# Patient Record
Sex: Female | Born: 1975 | Race: White | Hispanic: No | Marital: Single | Smoking: Former smoker
Health system: Southern US, Community
[De-identification: ages and names within clinical notes are randomized; demographics above are authoritative.]

## PROBLEM LIST (undated history)

## (undated) DIAGNOSIS — N390 Urinary tract infection, site not specified: Secondary | ICD-10-CM

## (undated) DIAGNOSIS — E119 Type 2 diabetes mellitus without complications: Secondary | ICD-10-CM

## (undated) DIAGNOSIS — F419 Anxiety disorder, unspecified: Secondary | ICD-10-CM

## (undated) HISTORY — PX: CHOLECYSTECTOMY: SHX55

---

## 2000-02-24 HISTORY — PX: SINUS EXPLORATION: SHX5214

## 2014-05-05 ENCOUNTER — Emergency Department (HOSPITAL_COMMUNITY)
Admission: EM | Admit: 2014-05-05 | Discharge: 2014-05-05 | Disposition: A | Discharge status: Home / Self Care | Payer: No Typology Code available for payment source | Attending: Emergency Medicine | Admitting: Emergency Medicine

## 2014-05-05 ENCOUNTER — Encounter (HOSPITAL_COMMUNITY): Payer: Self-pay | Admitting: Emergency Medicine

## 2014-05-05 DIAGNOSIS — S61432A Puncture wound without foreign body of left hand, initial encounter: Secondary | ICD-10-CM | POA: Diagnosis present

## 2014-05-05 DIAGNOSIS — S61032A Puncture wound without foreign body of left thumb without damage to nail, initial encounter: Secondary | ICD-10-CM | POA: Diagnosis not present

## 2014-05-05 DIAGNOSIS — L089 Local infection of the skin and subcutaneous tissue, unspecified: Secondary | ICD-10-CM | POA: Insufficient documentation

## 2014-05-05 DIAGNOSIS — Y9389 Activity, other specified: Secondary | ICD-10-CM | POA: Insufficient documentation

## 2014-05-05 DIAGNOSIS — Y9289 Other specified places as the place of occurrence of the external cause: Secondary | ICD-10-CM | POA: Diagnosis not present

## 2014-05-05 DIAGNOSIS — W5501XA Bitten by cat, initial encounter: Secondary | ICD-10-CM | POA: Insufficient documentation

## 2014-05-05 DIAGNOSIS — Y998 Other external cause status: Secondary | ICD-10-CM | POA: Diagnosis not present

## 2014-05-05 DIAGNOSIS — S61452A Open bite of left hand, initial encounter: Secondary | ICD-10-CM

## 2014-05-05 MED ORDER — MORPHINE SULFATE 4 MG/ML IJ SOLN
4.0000 mg | Freq: Once | INTRAMUSCULAR | Status: AC
  Administered 2014-05-05: 4 mg via INTRAVENOUS
  Filled 2014-05-05: qty 1

## 2014-05-05 MED ORDER — ONDANSETRON HCL 4 MG/2ML IJ SOLN
4.0000 mg | INTRAMUSCULAR | Status: AC
  Administered 2014-05-05: 4 mg via INTRAVENOUS
  Filled 2014-05-05: qty 2

## 2014-05-05 MED ORDER — LIDOCAINE HCL 1 % IJ SOLN
INTRAMUSCULAR | Status: AC
  Filled 2014-05-05: qty 20

## 2014-05-05 MED ORDER — LIDOCAINE-EPINEPHRINE 1 %-1:100000 IJ SOLN
INTRAMUSCULAR | Status: AC
  Filled 2014-05-05: qty 1

## 2014-05-05 MED ORDER — SODIUM CHLORIDE 0.9 % IV SOLN
3.0000 g | Freq: Once | INTRAVENOUS | Status: AC
  Administered 2014-05-05: 3 g via INTRAVENOUS
  Filled 2014-05-05: qty 3

## 2014-05-05 MED ORDER — LIDOCAINE HCL 2 % IJ SOLN
10.0000 mL | Freq: Once | INTRAMUSCULAR | Status: AC
  Administered 2014-05-05: 200 mg
  Filled 2014-05-05: qty 20

## 2014-05-05 NOTE — ED Notes (Signed)
Bed: WA07 Expected date:  Expected time:  Means of arrival:  Comments: [employer] for [employer]

## 2014-05-05 NOTE — ED Notes (Addendum)
Pt. Came in to Ed with complaint of animal bite, pt. Stated that she got bitten by her own cat at 1030pm last night on her left great thumb. This morning pt. Noted the affected site to be swollen and red and painful. Pt. Stated that her cat died at 11pm the same night she got bitten , pt. Stated that her cat had on and off seizures,  Pt. Stated that her cat had 10 minutes seizure before the cat  died.  Pt.'s cat was never noted to be sick , pt.'s cat was 5046years old . Pt.'s cat was updated of her vaccination and seen by Vet regularly.

## 2014-05-05 NOTE — Discharge Instructions (Signed)
Please follow the directions provided.  Dr. Carlos LeveringGramig's office should contact you to arrange follow-up.  Take your antibiotic as directed.  Don't hesitate to return for any new, worsening or concerning symptoms.     SEEK MEDICAL CARE IF:  You notice warmth, redness, soreness, swelling, pus discharge, or a bad smell coming from the wound.  You have a red line on the skin coming from the wound.  You have a fever, chills, or a general ill feeling.  You have nausea or vomiting.  You have continued or worsening pain.  You have trouble moving the injured part.  You have other questions or concerns.

## 2014-05-05 NOTE — ED Provider Notes (Signed)
CSN: [id number]     Arrival date & time 05/05/14  2017 History   First MD Initiated Contact with Patient 05/05/14 2024     This chart was scribed for non-physician practitioner, Harle BattiestElizabeth Calah Gershman, NP working with Nelva Nayobert Beaton, MD by Arlan OrganAshley Leger, ED Scribe. This patient was seen in room WTR7/WTR7 and the patient's care was started at 8:34 PM.   Chief Complaint  Patient presents with  . Animal Bite    cat   The history is provided by the patient. No language interpreter was used.    HPI Comments: Miranda Lawson is a 39 y.o. female who presents to the Emergency Department here after sustaining a cat bite and scratch to the L hand yesterday at approximately 10:30 PM. Pt states she was attempting to move her 41 year old, otherwise healthy cat into her crate after the animal was having multiple seizures when her cat bite her. Cat then passed at 11:00 PM last night. Ms. Kulpa noted swelling,  pain and redness to L thumb this morning. Last full meal approximately 1 hour ago. Ms. Olivares denies any fever or chills. Tetanus UTD in last 5 years. Pet cat immunizations UTD. LNMP today. No known allergies to medications.  No past medical history on file. No past surgical history on file. No family history on file. History  Substance Use Topics  . Smoking status: Not on file  . Smokeless tobacco: Not on file  . Alcohol Use: Not on file   OB History    No data available     Review of Systems  Constitutional: Negative for fever and chills.  Skin: Positive for color change and wound.     Allergies  Review of patient's allergies indicates not on file.  Home Medications   Prior to Admission medications   Not on File   Triage Vitals: BP 146/88 mmHg  Pulse 88  Temp(Src) 98.8 F (37.1 C) (Oral)  Resp 18  SpO2 99%   Physical Exam  Constitutional: She is oriented to person, place, and time. She appears well-developed and well-nourished.  HENT:  Head: Normocephalic.  Eyes: EOM are normal.  Neck:  Normal range of motion.  Pulmonary/Chest: Effort normal.  Abdominal: She exhibits no distension.  Musculoskeletal: Normal range of motion. She exhibits tenderness.  Mild tenderness noted to L thumb  Neurological: She is alert and oriented to person, place, and time.  5/5 strength to R thumb  Skin: There is erythema.  Small scratch mark to dorsal aspect of hand medial to to thumb and lateral to the arm 2 small punctate bite marks to the medial aspect of the first phalanx but with redness, swelling, and induration. Red streaking noted to R thumb  Psychiatric: She has a normal mood and affect.  Nursing note and vitals reviewed.   ED Course  Procedures (including critical care time)  DIAGNOSTIC STUDIES: Oxygen Saturation is 99% on RA, Normal by my interpretation.    COORDINATION OF CARE: 8:40 PM- Will consult with hand surgeon. Discussed treatment plan with pt at bedside and pt agreed to plan.     Labs Review Labs Reviewed - No data to display  Imaging Review No results found.   EKG Interpretation None      MDM   Final diagnoses:  Bite wound of left hand with infection, initial encounter   39 yo with redness, swelling and tenderness to left thumb 1 Meding after cat bite with streaking up her wrist. Consulted Dr. Amanda PeaGramig (hand surgery), requested  suture cart, 2% lidocaine without epi and hand tray by the bedside and he will eval in the ED. Pt given 3 gm Unasyn IV.  Pt seen by Dr. Amanda Pea in the ED with instructions for follow-up and prescriptions provided. She is well-appearing, in no acute distress and vital signs reviewed and not concerning. She appears safe to be discharged.  Discharge include follow-up with Dr. Carlos Levering office as instructed by him. Return precautions provided. Pt aware of plan and in agreement.    I personally performed the services described in this documentation, which was scribed in my presence. The recorded information has been reviewed and is  accurate.  Filed Vitals:   05/05/14 2020 05/05/14 2246  BP: 146/88 139/78  Pulse: 88 76  Temp: 98.8 F (37.1 C) 98.3 F (36.8 C)  TempSrc: Oral Oral  Resp: 18 16  SpO2: 99% 96%   Meds given in ED:  Medications  Ampicillin-Sulbactam (UNASYN) 3 g in sodium chloride 0.9 % 100 mL IVPB (0 g Intravenous Stopped 05/05/14 2255)  morphine 4 MG/ML injection 4 mg (4 mg Intravenous Given 05/05/14 2127)  ondansetron (ZOFRAN) injection 4 mg (4 mg Intravenous Given 05/05/14 2127)  lidocaine (XYLOCAINE) 2 % (with pres) injection 200 mg (200 mg Infiltration Given 05/05/14 2230)    Discharge Medication List as of 05/05/2014 10:32 PM       Harle Battiest, NP 05/08/14 0009  Nelva Nay, MD 05/14/14 [phone]

## 2014-05-05 NOTE — Consult Note (Signed)
Reason for Consult:leftthumb cat bite Referring Physician: ER staff  Miranda Lawson is an 39 y.o. female.  HPI: presents with a cat bite to the left thumb with infection  Patient presents for evaluation and treatment of the of their upper extremity predicament. The patient denies neck back chest or of abdominal pain. The patient notes that they have no lower extremity problems. The patient from primarily complains of the upper extremity pain noted.  History reviewed. No pertinent past medical history.  Past Surgical History  Procedure Laterality Date  . Cholecystectomy      History reviewed. No pertinent family history.  Social History:  reports that she has been smoking.  She does not have any smokeless tobacco history on file. She reports that she drinks alcohol. Her drug history is not on file.  Allergies: No Known Allergies  Medications: I have reviewed the patient's current medications.  No results found for this or any previous visit (from the past 48 hour(s)).  No results found.  Review of Systems  HENT: Negative.   Eyes: Negative.   Cardiovascular: Negative.   Gastrointestinal: Negative.   Genitourinary: Negative.   Psychiatric/Behavioral: Negative.    Blood pressure 146/88, pulse 88, temperature 98.8 F (37.1 C), temperature source Oral, resp. rate 18, SpO2 99 %. Physical Exam Infected left thumb catbite with draining wounds and no Kanavel signs The patient is alert and oriented in no acute distress the patient complains of pain in the affected upper extremity.  The patient is noted to have a normal HEENT exam.  Lung fields show equal chest expansion and no shortness of breath  abdomen exam is nontender without distention.  Lower extremity examination does not show any fracture dislocation or blood clot symptoms.  Pelvis is stable neck and back are stable and nontender Assessment/Plan: Infected cat bite left thumb  We are planning surgery for your upper extremity.  The risk and benefits of surgery include risk of bleeding infection anesthesia damage to normal structures and failure of the surgery to accomplish its intended goals of relieving symptoms and restoring function with this in mind we'll going to proceed. I have specifically discussed with the patient the pre-and postoperative regime and the does and don'ts and risk and benefits in great detail. Risk and benefits of surgery also include risk of dystrophy chronic nerve pain failure of the healing process to go onto completion and other inherent risks of surgery The relavent the pathophysiology of the disease/injury process, as well as the alternatives for treatment and postoperative course of action has been discussed in great detail with the patient who desires to proceed.  We will do everything in our power to help you (the patient) restore function to the upper extremity. Is a pleasure to see this patient today.    See full op note dictation#[id number] Dc on Augmentin 875 BID for 2 weeks and Oxy IR prn pain  Follow up Monday my office We will call to arrange  Keep bandage clean and dry.  Call for any problems.  No smoking.  Criteria for driving a car: you should be off your pain medicine for 7-8 hours, able to drive one handed(confident), thinking clearly and feeling able in your judgement to drive. Continue elevation as it will decrease swelling.  If instructed by MD move your fingers within the confines of the bandage/splint.  Use ice if instructed by your MD. Call immediately for any sudden loss of feeling in your hand/arm or change in functional abilities of the  extremity.  We recommend that you to take vitamin C 1000 mg a Friedel to promote healing we also recommend that if you require her pain medicine that he take a stool softener to prevent constipation as most pain medicines will have constipation side effects. We recommend either Peri-Colace or Senokot and recommend that you also consider adding  MiraLAX to prevent the constipation affects from pain medicine if you are required to use them. These medicines are over the counter and maybe purchased at a local pharmacy.  Karen Chafe 05/05/2014, 10:36 PM

## 2014-05-06 NOTE — Op Note (Signed)
NAMTomasa Lawson:  Lawson, Miranda                   ACCOUNT NO.:  [id number]  MEDICAL RECORD NO.:  [id number]  LOCATION:  WA07                         [facility]  PHYSICIAN:  Dionne AnoWilliam M. Belicia Difatta, M.D.DATE OF BIRTH:  03-Nov-1975  DATE OF PROCEDURE: DATE OF DISCHARGE:                              OPERATIVE REPORT   PREOPERATIVE DIAGNOSIS:  Cat bite with abscess, left thumb.  POSTOPERATIVE DIAGNOSIS:  Cat bite with abscess, left thumb.  PROCEDURES: 1. Irrigation and debridement, deep abscess, left thumb dorsoulnar     aspect. 2. I and D, deep abscess, pulp tissue volar in location, left thumb. 3. Partial nail plate removal, left thumb with nail bed debridement.  SURGEON:  Dionne AnoWilliam M. Amanda PeaGramig, M.D.  ASSISTANT:  None.  COMPLICATIONS:  None.  ANESTHESIA:  Local intermetacarpal block and field block.  INDICATION OF PROCEDURE:  This patient is a pleasant 39 year old female who yesterday was bitten by a cat in multiple locations about her thumb. She has no evidence of advanced flexor sheath infection.  She has obvious abnormality about the thumb with infection.  I have counseled her in regard to risks and benefits of surgery and she desires to proceed.  DESCRIPTION OF PROCEDURE:  The patient was seen by myself, underwent a smooth induction of block anesthetic in the form of lidocaine without epinephrine.  She was prepped and draped in usual sterile fashion with Betadine scrub x2.  Following this, she very carefully and cautiously had an incision made dorsoulnarly.  It was very apparent that the dorsoulnar wound tract deeply.  She had purulence emanated from the dorsoulnar aspect of her nail and this was evidence of a communicating paronychia involvement.  I thus performed a partial nail plate removal and then communicated the 2 sinus tracts together.  A drain was placed. This was an excisional debridement of skin, subcutaneous tissue, and I and D of the deep abscess dorsoulnarly.  Following  this, the volar pulp underwent similar I and D.  The patient had an incision made without difficulty and the tract was probed.  This was an I and D without difficulty, but deep abscess about the pulp excisional in nature.  She tolerated this well.  There were no complicating features.  I placed a drain through and through from this area to the dorsoulnar wound.  I then irrigated with greater than a L saline and washed the thumb aggressively.  Tourniquet time was brief and less than 20 minutes.  She had excellent refill and no complicating features.  She was dressed with Adaptic, Xeroform.  We will plan for Augmentin 875 mg 1 p.o. b.i.d. and OxyIR p.r.n. pain. Stool softener, vitamin C, and other measures were discussed and will be adhered to.  I will call her at her home number at [phone], a regime of whirlpool to be begun Monday with associated removal of the drain.  I have discussed with her that this will likely be a slow healing time, but nevertheless, I am confident she has a good chance of healing this without further aggressive surgical issues.  These notes have been discussed and all questions have been encouraged and answered. We will be immediately available  should any problems occur of course.  It is a pleasure to see her today and participate in her care.  We look forward to participate in postop recovery.     Dionne Ano. Amanda Pea, M.D.     [facility]  D:  05/05/2014  T:  05/05/2014  Job:  [id number]

## 2014-05-08 LAB — WOUND CULTURE: Gram Stain: NONE SEEN

## 2014-11-21 ENCOUNTER — Other Ambulatory Visit: Payer: Self-pay | Admitting: Internal Medicine

## 2014-11-21 DIAGNOSIS — Z1231 Encounter for screening mammogram for malignant neoplasm of breast: Secondary | ICD-10-CM

## 2014-11-30 ENCOUNTER — Ambulatory Visit
Admission: RE | Admit: 2014-11-30 | Discharge: 2014-11-30 | Disposition: A | Discharge status: Home / Self Care | Payer: No Typology Code available for payment source | Source: Ambulatory Visit | Attending: Internal Medicine | Admitting: Internal Medicine

## 2014-11-30 DIAGNOSIS — Z1231 Encounter for screening mammogram for malignant neoplasm of breast: Secondary | ICD-10-CM

## 2014-12-04 ENCOUNTER — Other Ambulatory Visit: Payer: Self-pay | Admitting: Internal Medicine

## 2014-12-04 DIAGNOSIS — R928 Other abnormal and inconclusive findings on diagnostic imaging of breast: Secondary | ICD-10-CM

## 2014-12-12 ENCOUNTER — Ambulatory Visit
Admission: RE | Admit: 2014-12-12 | Discharge: 2014-12-12 | Disposition: A | Discharge status: Home / Self Care | Payer: No Typology Code available for payment source | Source: Ambulatory Visit | Attending: Internal Medicine | Admitting: Internal Medicine

## 2014-12-12 ENCOUNTER — Other Ambulatory Visit: Payer: Self-pay | Admitting: Internal Medicine

## 2014-12-12 DIAGNOSIS — R928 Other abnormal and inconclusive findings on diagnostic imaging of breast: Secondary | ICD-10-CM

## 2014-12-12 DIAGNOSIS — R921 Mammographic calcification found on diagnostic imaging of breast: Secondary | ICD-10-CM

## 2014-12-13 ENCOUNTER — Ambulatory Visit
Admission: RE | Admit: 2014-12-13 | Discharge: 2014-12-13 | Disposition: A | Discharge status: Home / Self Care | Payer: No Typology Code available for payment source | Source: Ambulatory Visit | Attending: Internal Medicine | Admitting: Internal Medicine

## 2014-12-13 ENCOUNTER — Other Ambulatory Visit: Payer: Self-pay | Admitting: Internal Medicine

## 2014-12-13 ENCOUNTER — Ambulatory Visit
Admission: RE | Admit: 2014-12-13 | Discharge: 2014-12-13 | Disposition: A | Discharge status: Home / Self Care | Payer: No Typology Code available for payment source | Source: Ambulatory Visit

## 2014-12-13 DIAGNOSIS — R921 Mammographic calcification found on diagnostic imaging of breast: Secondary | ICD-10-CM

## 2014-12-31 ENCOUNTER — Ambulatory Visit: Payer: Self-pay | Admitting: Surgery

## 2014-12-31 DIAGNOSIS — R928 Other abnormal and inconclusive findings on diagnostic imaging of breast: Secondary | ICD-10-CM

## 2014-12-31 NOTE — H&P (Signed)
Miranda Lawson 12/31/2014 2:17 PM Location: [facility] Patient #: [id number] DOB: 1975/03/14 Single / Language: [place] / Race: White Female  History of Present Illness Miranda Lawson(Miranda Lawson [name] MD; 12/31/2014 4:05 PM) Patient words: Pt sent at the request of Dr Neil Crouchrane for left breast microcalcifications and CORE BX OF ADH. SHE DENIES ANY PAIN DISCHARGE OR MASS. NO FAMILY HX OF BREAST PROBLEMS.           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 EXAM: DIAGNOSTIC LEFT MAMMOGRAM POST STEREOTACTIC BIOPSY COMPARISON: Previous exam(s). FINDINGS: Mammographic images were obtained following stereotactic guided biopsy of left breast microcalcifications. This demonstrates satisfactory positioning of the X shaped metal tissue marker and focal post biopsy change. IMPRESSION: Satisfactory positioning of the metal tissue marker following stereotactic guided biopsy. Final Assessment: Post Procedure Mammograms for Marker Placement Electronically Signed By: Raymondo BandAndrew Crane On: 12/13/2014 15:13     CLINICAL DATA: 39 year old female with possible right breast masses and left breast calcifications identified on recent screening mammogram.  EXAM: DIGITAL DIAGNOSTIC BILATERAL MAMMOGRAM WITH 3D TOMOSYNTHESIS WITH CAD  ULTRASOUND RIGHT BREAST  COMPARISON: 11/30/2014 screening mammogram  ACR Breast Density Category b: There are scattered areas of fibroglandular density.  FINDINGS: 2D and 3D views of both breast and most spot magnification views of the left breast are performed.  Low-density circumscribed oval masses are identified within the inner lower and central right breast.  A 6 mm group of heterogeneous calcifications is identified within the anterior upper inner left breast. No associated mass  or distortion identified.  Mammographic images were processed with CAD.  On physical exam, no palpable abnormalities are identified within the right breast.  Targeted ultrasound is performed, showing benign cysts within the right breast. A 0.9 x 0.3 x 0.9 cm complicated cyst at the 5 o'clock position 4 cm from the nipple and a 0.5 x 0.2 x 0.6 cm simple cyst at the 10 o'clock position 4 cm from the nipple correspond to the screening study findings.  IMPRESSION: 6 mm group of indeterminate left breast calcifications- tissue sampling is recommended.   Benign cysts within the right breast corresponding to the screening study findings.  RECOMMENDATION: Stereotactic guided left breast biopsy, which has been scheduled for 12/12/2017 and the patient informed.  I have discussed the findings and recommendations with the patient. Results were also provided in writing at the conclusion of the visit. If applicable, a reminder letter will be sent to the patient regarding the next appointment.  The patient is a 39 year old female.   Other Problems Miranda Lawson([name], CMA; 12/31/2014 2:17 PM) Anxiety Disorder Cholelithiasis  Past Surgical History Miranda Lawson([name], CMA; 12/31/2014 2:17 PM) Breast Biopsy Left. Gallbladder Surgery - Laparoscopic  Diagnostic Studies History Miranda Lawson([name], [place]; 12/31/2014 2:17 PM) Colonoscopy never Mammogram within last year Pap Smear 1-5 years ago  Allergies Miranda Lawson([name], CMA; 12/31/2014 2:18 PM) No Known Drug Allergies 12/31/2014  Medication History Miranda Lawson([name], CMA; 12/31/2014 2:18 PM) BusPIRone HCl (15MG  Tablet, Oral) Active. Medications Reconciled  Social History Miranda Lawson([name], [place]; 12/31/2014 2:17 PM) Alcohol use Occasional alcohol use. Caffeine use Coffee. No drug use Tobacco use Current every [place] smoker.  Family History Miranda Lawson([name], [place]; 12/31/2014 2:17 PM) Arthritis Father.  Pregnancy / Birth History Miranda Lawson([name], CMA; 12/31/2014 2:17  PM) Age at menarche 11 years. Contraceptive History Oral contraceptives. Gravida 1 Maternal  age 72-20 Para 0 Regular periods     Review of Systems Miranda Lawson CMA; 12/31/2014 2:17 PM) General Not Present- Appetite Loss, Chills, Fatigue, Fever, Night Sweats, Weight Gain and Weight Loss. Skin Not Present- Change in Wart/Mole, Dryness, Hives, Jaundice, New Lesions, Non-Healing Wounds, Rash and Ulcer. HEENT Present- Seasonal Allergies and Wears glasses/contact lenses. Not Present- Earache, Hearing Loss, Hoarseness, Nose Bleed, Oral Ulcers, Ringing in the Ears, Sinus Pain, Sore Throat, Visual Disturbances and Yellow Eyes. Respiratory Not Present- Bloody sputum, Chronic Cough, Difficulty Breathing, Snoring and Wheezing. Breast Not Present- Breast Mass, Breast Pain, Nipple Discharge and Skin Changes. Cardiovascular Not Present- Chest Pain, Difficulty Breathing Lying Down, Leg Cramps, Palpitations, Rapid Heart Rate, Shortness of Breath and Swelling of Extremities. Gastrointestinal Not Present- Abdominal Pain, Bloating, Bloody Stool, Change in Bowel Habits, Chronic diarrhea, Constipation, Difficulty Swallowing, Excessive gas, Gets full quickly at meals, Hemorrhoids, Indigestion, Nausea, Rectal Pain and Vomiting. Female Genitourinary Not Present- Frequency, Nocturia, Painful Urination, Pelvic Pain and Urgency. Musculoskeletal Not Present- Back Pain, Joint Pain, Joint Stiffness, Muscle Pain, Muscle Weakness and Swelling of Extremities. Neurological Present- Numbness and Tingling. Not Present- Decreased Memory, Fainting, Headaches, Seizures, Tremor, Trouble walking and Weakness. Psychiatric Present- Anxiety. Not Present- Bipolar, Change in Sleep Pattern, Depression, Fearful and Frequent crying. Endocrine Not Present- Cold Intolerance, Excessive Hunger, Hair Changes, Heat Intolerance, Hot flashes and New Diabetes. Hematology Not Present- Easy Bruising, Excessive bleeding, Gland problems, HIV and  Persistent Infections.  Vitals Miranda Lawson CMA; 12/31/2014 2:18 PM) 12/31/2014 2:18 PM Weight: 212 lb Height: 69in Body Surface Area: 2.12 m Body Mass Index: 31.31 kg/m  Temp.: 97.69F(Temporal)  Pulse: 72 (Regular)  BP: 128/76 (Sitting, Left Arm, Standard)      Physical Exam (Aundreya Souffrant A. Fable Huisman MD; 12/31/2014 4:05 PM)  General Mental Status-Alert. General Appearance-Consistent with stated age. Hydration-Well hydrated. Voice-Normal.  Head and Neck Head-normocephalic, atraumatic with no lesions or palpable masses. Trachea-midline. Thyroid Gland Characteristics - normal size and consistency.  Eye Eyeball - Bilateral-Extraocular movements intact. Sclera/Conjunctiva - Bilateral-No scleral icterus.  Chest and Lung Exam Chest and lung exam reveals -quiet, even and easy respiratory effort with no use of accessory muscles and on auscultation, normal breath sounds, no adventitious sounds and normal vocal resonance. Inspection Chest Wall - Normal. Back - normal.  Breast Breast - Left-Symmetric, Non Tender, No Biopsy scars, no Dimpling, No Inflammation, No Lumpectomy scars, No Mastectomy scars, No Peau d' Orange. Breast - Right-Symmetric, Non Tender, No Biopsy scars, no Dimpling, No Inflammation, No Lumpectomy scars, No Mastectomy scars, No Peau d' Orange. Breast Lump-No Palpable Breast Mass.  Cardiovascular Cardiovascular examination reveals -normal heart sounds, regular rate and rhythm with no murmurs and normal pedal pulses bilaterally.  Abdomen Inspection Inspection of the abdomen reveals - No Hernias. Skin - Scar - no surgical scars. Palpation/Percussion Palpation and Percussion of the abdomen reveal - Soft, Non Tender, No Rebound tenderness, No Rigidity (guarding) and No hepatosplenomegaly. Auscultation Auscultation of the abdomen reveals - Bowel sounds normal.  Neurologic Neurologic evaluation reveals -alert and oriented x 3 with  no impairment of recent or remote memory. Mental Status-Normal.  Musculoskeletal Normal Exam - Left-Upper Extremity Strength Normal and Lower Extremity Strength Normal. Normal Exam - Right-Upper Extremity Strength Normal and Lower Extremity Strength Normal.  Lymphatic Head & Neck  General Head & Neck Lymphatics: Bilateral - Description - Normal. Axillary  General Axillary Region: Bilateral - Description - Normal. Tenderness - Non Tender. Femoral & Inguinal  Generalized Femoral & Inguinal Lymphatics: Bilateral - Description -  Normal. Tenderness - Non Tender.    Assessment & Plan (Mahala Rommel A. Eilee Schader MD; 12/31/2014 2:49 PM)  MICROCALCIFICATION OF LEFT BREAST ON MAMMOGRAM (R92.0)  Current Plans Pt Education - Patient education: Common breast problems (Beyond the Basics): discussed with patient and provided information. ATYPICAL DUCTAL HYPERPLASIA OF LEFT BREAST (N60.92) Impression: recommed left breast lumpectomy due to smal risk of malignancy. Risk of lumpectomy include bleeding, infection, seroma, more surgery, use of seed/wire, wound care, cosmetic deformity and the need for other treatments, death , blood clots, death. Pt agrees to proceed.  Current Plans You are being scheduled for surgery - Our schedulers will call you.  You should hear from our office's scheduling department within 5 working days about the location, date, and time of surgery. We try to make accommodations for patient's preferences in scheduling surgery, but sometimes the OR schedule or the surgeon's schedule prevents [place] from making those accommodations.  If you have not heard from our office [phone]) in 5 working days, call the office and ask for your surgeon's nurse.  If you have other questions about your diagnosis, plan, or surgery, call the office and ask for your surgeon's nurse.  We discussed the staging and pathophysiology of breast cancer. We discussed all of the different options for  treatment for breast cancer including surgery, chemotherapy, radiation therapy, Herceptin, and antiestrogen therapy. We discussed a sentinel lymph node biopsy as she does not appear to having lymph node involvement right now. We discussed the performance of that with injection of radioactive tracer and blue dye. We discussed that she would have an incision underneath her axillary hairline. We discussed that there is a bout a 10-20% chance of having a positive node with a sentinel lymph node biopsy and we will await the permanent pathology to make any other first further decisions in terms of her treatment. One of these options might be to return to the operating room to perform an axillary lymph node dissection. We discussed about a 1-2% risk lifetime of chronic shoulder pain as well as lymphedema associated with a sentinel lymph node biopsy. We discussed the options for treatment of the breast cancer which included lumpectomy versus a mastectomy. We discussed the performance of the lumpectomy with a wire placement. We discussed a 10-20% chance of a positive margin requiring reexcision in the operating room. We also discussed that she may need radiation therapy or antiestrogen therapy or both if she undergoes lumpectomy. We discussed the mastectomy and the postoperative care for that as well. We discussed that there is no difference in her survival whether she undergoes lumpectomy with radiation therapy or antiestrogen therapy versus a mastectomy. There is a slight difference in the local recurrence rate being 3-5% with lumpectomy and about 1% with a mastectomy. We discussed the risks of operation including bleeding, infection, possible reoperation. She understands her further therapy will be based on what her stages at the time of her operation.  Pt Education - [facility] Breast Biopsy HCI: discussed with patient and provided information.

## 2015-02-04 ENCOUNTER — Other Ambulatory Visit: Payer: Self-pay | Admitting: Surgery

## 2015-02-04 DIAGNOSIS — R928 Other abnormal and inconclusive findings on diagnostic imaging of breast: Secondary | ICD-10-CM

## 2015-03-13 ENCOUNTER — Encounter (HOSPITAL_BASED_OUTPATIENT_CLINIC_OR_DEPARTMENT_OTHER): Payer: Self-pay | Admitting: *Deleted

## 2015-03-15 ENCOUNTER — Encounter (HOSPITAL_BASED_OUTPATIENT_CLINIC_OR_DEPARTMENT_OTHER)
Admission: RE | Admit: 2015-03-15 | Discharge: 2015-03-15 | Disposition: A | Discharge status: Home / Self Care | Payer: Commercial Managed Care - HMO | Source: Ambulatory Visit | Attending: Surgery | Admitting: Surgery

## 2015-03-15 DIAGNOSIS — N6019 Diffuse cystic mastopathy of unspecified breast: Secondary | ICD-10-CM | POA: Diagnosis not present

## 2015-03-15 DIAGNOSIS — Z01812 Encounter for preprocedural laboratory examination: Secondary | ICD-10-CM | POA: Diagnosis not present

## 2015-03-15 LAB — CBC WITH DIFFERENTIAL/PLATELET
BASOS ABS: 0 10*3/uL (ref 0.0–0.1)
Basophils Relative: 0 %
Eosinophils Absolute: 0.1 10*3/uL (ref 0.0–0.7)
Eosinophils Relative: 1 %
HEMATOCRIT: 40.1 % (ref 36.0–46.0)
Hemoglobin: 13.8 g/dL (ref 12.0–15.0)
LYMPHS ABS: 4.1 10*3/uL — AB (ref 0.7–4.0)
LYMPHS PCT: 36 %
MCH: 31.5 pg (ref 26.0–34.0)
MCHC: 34.4 g/dL (ref 30.0–36.0)
MCV: 91.6 fL (ref 78.0–100.0)
MONO ABS: 0.6 10*3/uL (ref 0.1–1.0)
Monocytes Relative: 6 %
NEUTROS ABS: 6.5 10*3/uL (ref 1.7–7.7)
Neutrophils Relative %: 57 %
Platelets: 266 10*3/uL (ref 150–400)
RBC: 4.38 MIL/uL (ref 3.87–5.11)
RDW: 12.4 % (ref 11.5–15.5)
WBC: 11.3 10*3/uL — AB (ref 4.0–10.5)

## 2015-03-15 LAB — COMPREHENSIVE METABOLIC PANEL
ALT: 27 U/L (ref 14–54)
AST: 30 U/L (ref 15–41)
Albumin: 4 g/dL (ref 3.5–5.0)
Alkaline Phosphatase: 51 U/L (ref 38–126)
Anion gap: 10 (ref 5–15)
BILIRUBIN TOTAL: 0.4 mg/dL (ref 0.3–1.2)
BUN: 12 mg/dL (ref 6–20)
CALCIUM: 9.4 mg/dL (ref 8.9–10.3)
CO2: 23 mmol/L (ref 22–32)
CREATININE: 0.97 mg/dL (ref 0.44–1.00)
Chloride: 102 mmol/L (ref 101–111)
GFR calc Af Amer: >60 mL/min (ref >60)
Glucose, Bld: 105 mg/dL — ABNORMAL HIGH (ref 65–99)
Potassium: 4.3 mmol/L (ref 3.5–5.1)
Sodium: 135 mmol/L (ref 135–145)
TOTAL PROTEIN: 7.1 g/dL (ref 6.5–8.1)

## 2015-03-18 ENCOUNTER — Ambulatory Visit
Admission: RE | Admit: 2015-03-18 | Discharge: 2015-03-18 | Disposition: A | Discharge status: Home / Self Care | Payer: No Typology Code available for payment source | Source: Ambulatory Visit | Attending: Surgery | Admitting: Surgery

## 2015-03-18 DIAGNOSIS — R928 Other abnormal and inconclusive findings on diagnostic imaging of breast: Secondary | ICD-10-CM

## 2015-03-21 ENCOUNTER — Encounter (HOSPITAL_BASED_OUTPATIENT_CLINIC_OR_DEPARTMENT_OTHER): Payer: Self-pay | Admitting: *Deleted

## 2015-03-21 ENCOUNTER — Ambulatory Visit
Admission: RE | Admit: 2015-03-21 | Discharge: 2015-03-21 | Disposition: A | Discharge status: Home / Self Care | Payer: No Typology Code available for payment source | Source: Ambulatory Visit | Attending: Surgery | Admitting: Surgery

## 2015-03-21 ENCOUNTER — Ambulatory Visit (HOSPITAL_BASED_OUTPATIENT_CLINIC_OR_DEPARTMENT_OTHER): Payer: Commercial Managed Care - HMO | Admitting: Anesthesiology

## 2015-03-21 ENCOUNTER — Ambulatory Visit (HOSPITAL_BASED_OUTPATIENT_CLINIC_OR_DEPARTMENT_OTHER)
Admission: RE | Admit: 2015-03-21 | Discharge: 2015-03-21 | Disposition: A | Discharge status: Home / Self Care | Payer: Commercial Managed Care - HMO | Source: Ambulatory Visit | Attending: Surgery | Admitting: Surgery

## 2015-03-21 ENCOUNTER — Encounter (HOSPITAL_BASED_OUTPATIENT_CLINIC_OR_DEPARTMENT_OTHER)
Admission: RE | Disposition: A | Discharge status: Home / Self Care | Payer: Self-pay | Source: Ambulatory Visit | Attending: Surgery

## 2015-03-21 DIAGNOSIS — N6489 Other specified disorders of breast: Secondary | ICD-10-CM | POA: Insufficient documentation

## 2015-03-21 DIAGNOSIS — E669 Obesity, unspecified: Secondary | ICD-10-CM | POA: Diagnosis not present

## 2015-03-21 DIAGNOSIS — F419 Anxiety disorder, unspecified: Secondary | ICD-10-CM | POA: Insufficient documentation

## 2015-03-21 DIAGNOSIS — F17200 Nicotine dependence, unspecified, uncomplicated: Secondary | ICD-10-CM | POA: Insufficient documentation

## 2015-03-21 DIAGNOSIS — Z6831 Body mass index (BMI) 31.0-31.9, adult: Secondary | ICD-10-CM | POA: Insufficient documentation

## 2015-03-21 DIAGNOSIS — R928 Other abnormal and inconclusive findings on diagnostic imaging of breast: Secondary | ICD-10-CM

## 2015-03-21 DIAGNOSIS — N6092 Unspecified benign mammary dysplasia of left breast: Secondary | ICD-10-CM | POA: Diagnosis present

## 2015-03-21 HISTORY — PX: BREAST LUMPECTOMY WITH RADIOACTIVE SEED LOCALIZATION: SHX6424

## 2015-03-21 HISTORY — DX: Anxiety disorder, unspecified: F41.9

## 2015-03-21 SURGERY — BREAST LUMPECTOMY WITH RADIOACTIVE SEED LOCALIZATION
Anesthesia: General | Site: Breast | Laterality: Left

## 2015-03-21 MED ORDER — DEXAMETHASONE SODIUM PHOSPHATE 10 MG/ML IJ SOLN
INTRAMUSCULAR | Status: AC
  Filled 2015-03-21: qty 1

## 2015-03-21 MED ORDER — DEXAMETHASONE SODIUM PHOSPHATE 4 MG/ML IJ SOLN
INTRAMUSCULAR | Status: DC | PRN
  Administered 2015-03-21: 10 mg via INTRAVENOUS

## 2015-03-21 MED ORDER — OXYCODONE HCL 5 MG PO TABS
ORAL_TABLET | ORAL | Status: AC
  Filled 2015-03-21: qty 1

## 2015-03-21 MED ORDER — PROPOFOL 10 MG/ML IV BOLUS
INTRAVENOUS | Status: DC | PRN
  Administered 2015-03-21: 200 mg via INTRAVENOUS

## 2015-03-21 MED ORDER — LIDOCAINE HCL (CARDIAC) 20 MG/ML IV SOLN
INTRAVENOUS | Status: DC | PRN
  Administered 2015-03-21: 80 mg via INTRAVENOUS

## 2015-03-21 MED ORDER — CHLORHEXIDINE GLUCONATE 4 % EX LIQD: 1.0000 "application " | Freq: Once | CUTANEOUS | Status: DC

## 2015-03-21 MED ORDER — DEXTROSE 5 % IV SOLN
3.0000 g | INTRAVENOUS | Status: AC
  Administered 2015-03-21: 2 g via INTRAVENOUS

## 2015-03-21 MED ORDER — HYDROCODONE-ACETAMINOPHEN 5-325 MG PO TABS: 1.0000 | ORAL_TABLET | Freq: Four times a day (QID) | ORAL | Status: DC | PRN

## 2015-03-21 MED ORDER — LACTATED RINGERS IV SOLN
INTRAVENOUS | Status: DC
  Administered 2015-03-21: 07:00:00 via INTRAVENOUS

## 2015-03-21 MED ORDER — CEFAZOLIN SODIUM-DEXTROSE 2-3 GM-% IV SOLR
INTRAVENOUS | Status: AC
  Filled 2015-03-21: qty 50

## 2015-03-21 MED ORDER — HYDROCODONE-ACETAMINOPHEN 7.5-325 MG PO TABS: 1.0000 | ORAL_TABLET | Freq: Once | ORAL | Status: DC | PRN

## 2015-03-21 MED ORDER — MIDAZOLAM HCL 2 MG/2ML IJ SOLN
1.0000 mg | INTRAMUSCULAR | Status: DC | PRN
  Administered 2015-03-21: 2 mg via INTRAVENOUS

## 2015-03-21 MED ORDER — MIDAZOLAM HCL 2 MG/2ML IJ SOLN
INTRAMUSCULAR | Status: AC
  Filled 2015-03-21: qty 2

## 2015-03-21 MED ORDER — ONDANSETRON HCL 4 MG/2ML IJ SOLN
INTRAMUSCULAR | Status: AC
  Filled 2015-03-21: qty 2

## 2015-03-21 MED ORDER — SCOPOLAMINE 1 MG/3DAYS TD PT72: 1.0000 | MEDICATED_PATCH | Freq: Once | TRANSDERMAL | Status: DC | PRN

## 2015-03-21 MED ORDER — OXYCODONE HCL 5 MG PO TABS
5.0000 mg | ORAL_TABLET | Freq: Once | ORAL | Status: AC | PRN
  Administered 2015-03-21: 5 mg via ORAL

## 2015-03-21 MED ORDER — FENTANYL CITRATE (PF) 100 MCG/2ML IJ SOLN: 25.0000 ug | INTRAMUSCULAR | Status: DC | PRN

## 2015-03-21 MED ORDER — PROPOFOL 500 MG/50ML IV EMUL
INTRAVENOUS | Status: AC
  Filled 2015-03-21: qty 50

## 2015-03-21 MED ORDER — METOCLOPRAMIDE HCL 5 MG/ML IJ SOLN: 10.0000 mg | Freq: Once | INTRAMUSCULAR | Status: DC | PRN

## 2015-03-21 MED ORDER — FENTANYL CITRATE (PF) 100 MCG/2ML IJ SOLN
INTRAMUSCULAR | Status: AC
  Filled 2015-03-21: qty 2

## 2015-03-21 MED ORDER — MEPERIDINE HCL 25 MG/ML IJ SOLN: 6.2500 mg | INTRAMUSCULAR | Status: DC | PRN

## 2015-03-21 MED ORDER — SUCCINYLCHOLINE CHLORIDE 20 MG/ML IJ SOLN
INTRAMUSCULAR | Status: AC
Start: 2015-03-21 — End: 2015-03-21
  Filled 2015-03-21: qty 1

## 2015-03-21 MED ORDER — GLYCOPYRROLATE 0.2 MG/ML IJ SOLN: 0.2000 mg | Freq: Once | INTRAMUSCULAR | Status: DC | PRN

## 2015-03-21 MED ORDER — LIDOCAINE HCL (CARDIAC) 20 MG/ML IV SOLN
INTRAVENOUS | Status: AC
  Filled 2015-03-21: qty 5

## 2015-03-21 MED ORDER — BUPIVACAINE-EPINEPHRINE (PF) 0.25% -1:200000 IJ SOLN
INTRAMUSCULAR | Status: DC | PRN
  Administered 2015-03-21: 10 mL

## 2015-03-21 MED ORDER — BUPIVACAINE-EPINEPHRINE (PF) 0.25% -1:200000 IJ SOLN
INTRAMUSCULAR | Status: AC
  Filled 2015-03-21: qty 30

## 2015-03-21 MED ORDER — FENTANYL CITRATE (PF) 100 MCG/2ML IJ SOLN
50.0000 ug | INTRAMUSCULAR | Status: DC | PRN
  Administered 2015-03-21: 100 ug via INTRAVENOUS

## 2015-03-21 SURGICAL SUPPLY — 48 items
APPLIER CLIP 9.375 MED OPEN (MISCELLANEOUS)
BINDER BREAST LRG (GAUZE/BANDAGES/DRESSINGS) IMPLANT
BINDER BREAST MEDIUM (GAUZE/BANDAGES/DRESSINGS) IMPLANT
BINDER BREAST XLRG (GAUZE/BANDAGES/DRESSINGS) ×3 IMPLANT
BINDER BREAST XXLRG (GAUZE/BANDAGES/DRESSINGS) ×3 IMPLANT
BLADE SURG 15 STRL LF DISP TIS (BLADE) ×1 IMPLANT
BLADE SURG 15 STRL SS (BLADE) ×2
CANISTER SUC SOCK COL 7IN (MISCELLANEOUS) ×3 IMPLANT
CANISTER SUCT 1200ML W/VALVE (MISCELLANEOUS) IMPLANT
CHLORAPREP W/TINT 26ML (MISCELLANEOUS) ×3 IMPLANT
CLIP APPLIE 9.375 MED OPEN (MISCELLANEOUS) IMPLANT
COVER BACK TABLE 60X90IN (DRAPES) ×3 IMPLANT
COVER MAYO STAND STRL (DRAPES) ×3 IMPLANT
COVER PROBE W GEL 5X96 (DRAPES) ×3 IMPLANT
DECANTER SPIKE VIAL GLASS SM (MISCELLANEOUS) IMPLANT
DEVICE DUBIN W/COMP PLATE 8390 (MISCELLANEOUS) ×3 IMPLANT
DRAPE LAPAROSCOPIC ABDOMINAL (DRAPES) IMPLANT
DRAPE LAPAROTOMY 100X72 PEDS (DRAPES) ×3 IMPLANT
DRAPE UTILITY XL STRL (DRAPES) ×3 IMPLANT
ELECT COATED BLADE 2.86 ST (ELECTRODE) ×3 IMPLANT
ELECT REM PT RETURN 9FT ADLT (ELECTROSURGICAL) ×3
ELECTRODE REM PT RTRN 9FT ADLT (ELECTROSURGICAL) ×1 IMPLANT
GLOVE BIOGEL PI IND STRL 7.0 (GLOVE) ×1 IMPLANT
GLOVE BIOGEL PI IND STRL 8 (GLOVE) ×1 IMPLANT
GLOVE BIOGEL PI INDICATOR 7.0 (GLOVE) ×2
GLOVE BIOGEL PI INDICATOR 8 (GLOVE) ×2
GLOVE ECLIPSE 6.5 STRL STRAW (GLOVE) ×3 IMPLANT
GLOVE ECLIPSE 8.0 STRL XLNG CF (GLOVE) ×3 IMPLANT
GOWN STRL REUS W/ TWL LRG LVL3 (GOWN DISPOSABLE) ×2 IMPLANT
GOWN STRL REUS W/TWL LRG LVL3 (GOWN DISPOSABLE) ×4
HEMOSTAT SNOW SURGICEL 2X4 (HEMOSTASIS) IMPLANT
KIT MARKER MARGIN INK (KITS) ×3 IMPLANT
LIQUID BAND (GAUZE/BANDAGES/DRESSINGS) ×3 IMPLANT
NEEDLE HYPO 25X1 1.5 SAFETY (NEEDLE) ×3 IMPLANT
NS IRRIG 1000ML POUR BTL (IV SOLUTION) ×3 IMPLANT
PACK BASIN DAY SURGERY FS (CUSTOM PROCEDURE TRAY) ×3 IMPLANT
PENCIL BUTTON HOLSTER BLD 10FT (ELECTRODE) ×3 IMPLANT
SLEEVE SCD COMPRESS KNEE MED (MISCELLANEOUS) ×3 IMPLANT
SPONGE LAP 4X18 X RAY DECT (DISPOSABLE) ×3 IMPLANT
SUT MNCRL AB 4-0 PS2 18 (SUTURE) ×3 IMPLANT
SUT SILK 2 0 SH (SUTURE) IMPLANT
SUT VICRYL 3-0 CR8 SH (SUTURE) ×3 IMPLANT
SYR CONTROL 10ML LL (SYRINGE) ×3 IMPLANT
TOWEL OR 17X24 6PK STRL BLUE (TOWEL DISPOSABLE) ×3 IMPLANT
TOWEL OR NON WOVEN STRL DISP B (DISPOSABLE) ×3 IMPLANT
TUBE CONNECTING 20'X1/4 (TUBING)
TUBE CONNECTING 20X1/4 (TUBING) IMPLANT
YANKAUER SUCT BULB TIP NO VENT (SUCTIONS) IMPLANT

## 2015-03-21 NOTE — H&P (Signed)
H&P   Miranda Lawson (MR# [id number])      H&P Info    Chartered loss adjuster Note Status Last Update User Last Update Date/Time   Harriette Bouillon, MD Signed Harriette Bouillon, MD     H&P    Expand All Collapse All   [facility]  Location: [facility] Patient #: [id number] DOB: 27-Dec-1975 Single / Language: [place] / Race: White Female  History of Present Illness [name] A. Taeden Geller MD;  Patient words: Pt sent at the request of Dr Neil Crouch for left breast microcalcifications and CORE BX OF ADH. SHE DENIES ANY PAIN DISCHARGE OR MASS. NO FAMILY HX OF BREAST PROBLEMS.           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 EXAM: DIAGNOSTIC LEFT MAMMOGRAM POST STEREOTACTIC BIOPSY COMPARISON: Previous exam(s). FINDINGS: Mammographic images were obtained following stereotactic guided biopsy of left breast microcalcifications. This demonstrates satisfactory positioning of the X shaped metal tissue marker and focal post biopsy change. IMPRESSION: Satisfactory positioning of the metal tissue marker following stereotactic guided biopsy. Final Assessment: Post Procedure Mammograms for Marker Placement Electronically Signed By: Raymondo Band On: 12/13/2014 15:13     CLINICAL DATA: 40 year old female with possible right breast masses and left breast calcifications identified on recent screening mammogram.  EXAM: DIGITAL DIAGNOSTIC BILATERAL MAMMOGRAM WITH 3D TOMOSYNTHESIS WITH CAD  ULTRASOUND RIGHT BREAST  COMPARISON: 11/30/2014 screening mammogram  ACR Breast Density Category b: There are scattered areas of fibroglandular density.  FINDINGS: 2D and 3D views of both breast and most spot magnification views of the left breast are performed.  Low-density circumscribed oval masses are identified within  the inner lower and central right breast.  A 6 mm group of heterogeneous calcifications is identified within the anterior upper inner left breast. No associated mass or distortion identified.  Mammographic images were processed with CAD.  On physical exam, no palpable abnormalities are identified within the right breast.  Targeted ultrasound is performed, showing benign cysts within the right breast. A 0.9 x 0.3 x 0.9 cm complicated cyst at the 5 o'clock position 4 cm from the nipple and a 0.5 x 0.2 x 0.6 cm simple cyst at the 10 o'clock position 4 cm from the nipple correspond to the screening study findings.  IMPRESSION: 6 mm group of indeterminate left breast calcifications- tissue sampling is recommended.   Benign cysts within the right breast corresponding to the screening study findings.  RECOMMENDATION: Stereotactic guided left breast biopsy, which has been scheduled for 12/12/2017 and the patient informed.  I have discussed the findings and recommendations with the patient. Results were also provided in writing at the conclusion of the visit. If applicable, a reminder letter will be sent to the patient regarding the next appointment.  The patient is a 40 year old female.   Other Problems [name], CMA; 12/31/2014 2:17 PM) Anxiety Disorder Cholelithiasis  Past Surgical History  Breast Biopsy Left. Gallbladder Surgery - Laparoscopic  Diagnostic Studies History  Colonoscopy never Mammogram within last year Pap Smear 1-5 years ago  Allergies No Known Drug Allergies 12/31/2014  Medication History  BusPIRone HCl (  Tablet, Oral) Active. Medications Reconciled  Social History  Alcohol use Occasional alcohol use. Caffeine use Coffee. No drug use Tobacco use Current every [place] smoker.  Family History  Arthritis Father.  Pregnancy / Birth History  Age at menarche 63  years. Contraceptive History Oral contraceptives. Gravida  1 Maternal age 39-20 Para 0 Regular periods     Review of Systems  General Not Present- Appetite Loss, Chills, Fatigue, Fever, Night Sweats, Weight Gain and Weight Loss. Skin Not Present- Change in Wart/Mole, Dryness, Hives, Jaundice, New Lesions, Non-Healing Wounds, Rash and Ulcer. HEENT Present- Seasonal Allergies and Wears glasses/contact lenses. Not Present- Earache, Hearing Loss, Hoarseness, Nose Bleed, Oral Ulcers, Ringing in the Ears, Sinus Pain, Sore Throat, Visual Disturbances and Yellow Eyes. Respiratory Not Present- Bloody sputum, Chronic Cough, Difficulty Breathing, Snoring and Wheezing. Breast Not Present- Breast Mass, Breast Pain, Nipple Discharge and Skin Changes. Cardiovascular Not Present- Chest Pain, Difficulty Breathing Lying Down, Leg Cramps, Palpitations, Rapid Heart Rate, Shortness of Breath and Swelling of Extremities. Gastrointestinal Not Present- Abdominal Pain, Bloating, Bloody Stool, Change in Bowel Habits, Chronic diarrhea, Constipation, Difficulty Swallowing, Excessive gas, Gets full quickly at meals, Hemorrhoids, Indigestion, Nausea, Rectal Pain and Vomiting. Female Genitourinary Not Present- Frequency, Nocturia, Painful Urination, Pelvic Pain and Urgency. Musculoskeletal Not Present- Back Pain, Joint Pain, Joint Stiffness, Muscle Pain, Muscle Weakness and Swelling of Extremities. Neurological Present- Numbness and Tingling. Not Present- Decreased Memory, Fainting, Headaches, Seizures, Tremor, Trouble walking and Weakness. Psychiatric Present- Anxiety. Not Present- Bipolar, Change in Sleep Pattern, Depression, Fearful and Frequent crying. Endocrine Not Present- Cold Intolerance, Excessive Hunger, Hair Changes, Heat Intolerance, Hot flashes and New Diabetes. Hematology Not Present- Easy Bruising, Excessive bleeding, Gland problems, HIV and Persistent Infections.  Vitals [name] CMA;  12/31/2014 2:18 PM Weight: 212 lb Height: 69in Body Surface Area:  2.12 m Body Mass Index: 31.31 kg/m  Temp.: 97.66F(Temporal)  Pulse: 72 (Regular)  BP: 128/76 (Sitting, Left Arm, Standard)      Physical Exam   General Mental Status-Alert. General Appearance-Consistent with stated age. Hydration-Well hydrated. Voice-Normal.  Head and Neck Head-normocephalic, atraumatic with no lesions or palpable masses. Trachea-midline. Thyroid Gland Characteristics - normal size and consistency.  Eye Eyeball - Bilateral-Extraocular movements intact. Sclera/Conjunctiva - Bilateral-No scleral icterus.  Chest and Lung Exam Chest and lung exam reveals -quiet, even and easy respiratory effort with no use of accessory muscles and on auscultation, normal breath sounds, no adventitious sounds and normal vocal resonance. Inspection Chest Wall - Normal. Back - normal.  Breast Breast - Left-Symmetric, Non Tender, No Biopsy scars, no Dimpling, No Inflammation, No Lumpectomy scars, No Mastectomy scars, No Peau d' Orange. Breast - Right-Symmetric, Non Tender, No Biopsy scars, no Dimpling, No Inflammation, No Lumpectomy scars, No Mastectomy scars, No Peau d' Orange. Breast Lump-No Palpable Breast Mass.  Cardiovascular Cardiovascular examination reveals -normal heart sounds, regular rate and rhythm with no murmurs and normal pedal pulses bilaterally.  Abdomen Inspection Inspection of the abdomen reveals - No Hernias. Skin - Scar - no surgical scars. Palpation/Percussion Palpation and Percussion of the abdomen reveal - Soft, Non Tender, No Rebound tenderness, No Rigidity (guarding) and No hepatosplenomegaly. Auscultation Auscultation of the abdomen reveals - Bowel sounds normal.  Neurologic Neurologic evaluation reveals -alert and oriented x 3 with no impairment of recent or remote memory. Mental Status-Normal.  Musculoskeletal Normal Exam - Left-Upper Extremity Strength Normal and Lower Extremity Strength  Normal. Normal Exam - Right-Upper Extremity Strength Normal and Lower Extremity Strength Normal.  Lymphatic Head & Neck  General Head & Neck Lymphatics: Bilateral - Description - Normal. Axillary  General Axillary Region: Bilateral - Description - Normal. Tenderness - Non Tender. Femoral & Inguinal  Generalized Femoral & Inguinal Lymphatics: Bilateral - Description - Normal. Tenderness -  Non Tender.    Assessment & Plan   MICROCALCIFICATION OF LEFT BREAST ON MAMMOGRAM (R92.0)  Current Plans Pt Education - Patient education: Common breast problems (Beyond the Basics): discussed with patient and provided information. ATYPICAL DUCTAL HYPERPLASIA OF LEFT BREAST (N60.92) Impression: recommed left breast lumpectomy due to smal risk of malignancy. Risk of lumpectomy include bleeding, infection, seroma, more surgery, use of seed/wire, wound care, cosmetic deformity and the need for other treatments, death , blood clots, death. Pt agrees to proceed.  Current Plans You are being scheduled for surgery - Our schedulers will call you.  You should hear from our office's scheduling department within 5 working days about the location, date, and time of surgery. We try to make accommodations for patient's preferences in scheduling surgery, but sometimes the OR schedule or the surgeon's schedule prevents [place] from making those accommodations.  If you have not heard from our office [phone]) in 5 working days, call the office and ask for your surgeon's nurse.  If you have other questions about your diagnosis, plan, or surgery, call the office and ask for your surgeon's nurse.  We discussed the staging and pathophysiology of breast cancer. We discussed all of the different options for treatment for breast cancer including surgery, chemotherapy, radiation therapy, Herceptin, and antiestrogen therapy. We discussed a sentinel lymph node biopsy as she does not appear to having lymph node involvement  right now. We discussed the performance of that with injection of radioactive tracer and blue dye. We discussed that she would have an incision underneath her axillary hairline. We discussed that there is a bout a 10-20% chance of having a positive node with a sentinel lymph node biopsy and we will await the permanent pathology to make any other first further decisions in terms of her treatment. One of these options might be to return to the operating room to perform an axillary lymph node dissection. We discussed about a 1-2% risk lifetime of chronic shoulder pain as well as lymphedema associated with a sentinel lymph node biopsy. We discussed the options for treatment of the breast cancer which included lumpectomy versus a mastectomy. We discussed the performance of the lumpectomy with a wire placement. We discussed a 10-20% chance of a positive margin requiring reexcision in the operating room. We also discussed that she may need radiation therapy or antiestrogen therapy or both if she undergoes lumpectomy. We discussed the mastectomy and the postoperative care for that as well. We discussed that there is no difference in her survival whether she undergoes lumpectomy with radiation therapy or antiestrogen therapy versus a mastectomy. There is a slight difference in the local recurrence rate being 3-5% with lumpectomy and about 1% with a mastectomy. We discussed the risks of operation including bleeding, infection, possible reoperation. She understands her further therapy will be based on what her stages at the time of her operation.  Pt Education - [facility] Breast Biopsy HCI: discussed with patient and provided information.

## 2015-03-21 NOTE — Transfer of Care (Signed)
Immediate Anesthesia Transfer of Care Note  Patient: Miranda Lawson  Procedure(s) Performed: Procedure(s): LEFT BREAST LUMPECTOMY WITH RADIOACTIVE SEED LOCALIZATION (Left)  Patient Location: PACU  Anesthesia Type:General  Level of Consciousness: awake, oriented and patient cooperative  Airway & Oxygen Therapy: Patient Spontanous Breathing and Patient connected to face mask oxygen  Post-op Assessment: Report given to RN and Post -op Vital signs reviewed and stable  Post vital signs: Reviewed and stable  Last Vitals:  Filed Vitals:   03/21/15 0625  BP: 122/74  Pulse: 72  Temp: 36.6 C  Resp: 18    Complications: No apparent anesthesia complications

## 2015-03-21 NOTE — Anesthesia Postprocedure Evaluation (Signed)
Anesthesia Post Note  Patient: Miranda Lawson  Procedure(s) Performed: Procedure(s) (LRB): LEFT BREAST LUMPECTOMY WITH RADIOACTIVE SEED LOCALIZATION (Left)  Patient location during evaluation: PACU Anesthesia Type: General Level of consciousness: awake and alert and oriented Pain management: pain level controlled Vital Signs Assessment: post-procedure vital signs reviewed and stable Respiratory status: spontaneous breathing, nonlabored ventilation and respiratory function stable Cardiovascular status: blood pressure returned to baseline and stable Postop Assessment: no signs of nausea or vomiting Anesthetic complications: no    Last Vitals:  Filed Vitals:   03/21/15 0625  BP: 122/74  Pulse: 72  Temp: 36.6 C  Resp: 18    Last Pain: There were no vitals filed for this visit.               Elleni Mozingo A.

## 2015-03-21 NOTE — Interval H&P Note (Signed)
History and Physical Interval Note:  03/21/2015 7:21 AM  Miranda Lawson  has presented today for surgery, with the diagnosis of left breast microcalcification ATYPICAL DUCTAL HYPERPLASIA  The various methods of treatment have been discussed with the patient and family. After consideration of risks, benefits and other options for treatment, the patient has consented to  Procedure(s): LEFT BREAST LUMPECTOMY WITH RADIOACTIVE SEED LOCALIZATION (Left) as a surgical intervention .  The patient's history has been reviewed, patient examined, no change in status, stable for surgery.  I have reviewed the patient's chart and labs.  Questions were answered to the patient's satisfaction.     Madilyn Cephas A.

## 2015-03-21 NOTE — Op Note (Signed)
Preoperative diagnosis: Left breast Atypical ductal hyperplasia   Postoperative diagnosis: Same   Procedure: Left breast seed localized lumpectomy  Surgeon: Harriette Bouillon M.D.  Anesthesia: Gen. With 0.25% Sensorcaine local  EBL: 20 cc  Specimen: Left breast tissue with clip and radioactive seed in the specimen. Verified with neoprobe and radiographic image showing both seed and clip in specimen  Indications for procedure: The patient presents for left breast  lumpectomy after core biopsy showed ADH. Discussed the rationale for considering excision. Small risk of malignancy associated with ADH  lesion after core biopsy. Discussed observation. Discussed wire localization. Patient desired excision of left breast ADH.The procedure has been discussed with the patient. Alternatives to surgery have been discussed with the patient.  Risks of surgery include bleeding,  Infection,  Seroma formation, death,nipple loss,   and the need for further surgery.   The patient understands and wishes to proceed.   Description of procedure: Patient underwent seed placement as an outpatient. Patient presents today for left breast seed localized lumpectomy. Patient and holding area. Questions are answered and neoprobe used to verify seed location. Patient taken back to the operating room and placed upon the OR table. After induction of general anesthesia, left breast prepped and draped in a sterile fashion. Timeout was done to verify proper sizing procedure. Neoprobe used and hot spot identified and at the superior border of the nipple. This was marked with pen. Curvilinear incision made along superior border of the NAC.  Dissection used with the help of a neoprobe around the tissue where the seed and clip were located. Tissue removed in its entirety with gross  NEGATIVE  margins. Neoprobe used and seed within the specimen. Radiographs taken which show clip and seed  In specimen.Hemostasis achieved and cavity closed with  3-0 Vicryl and 4-0 Monocryl.  Liquid adhesive applied.  All final counts found to be correct. Specimen transported to pathology. Patient awoke extubated taken to recovery in satisfactory condition.

## 2015-03-21 NOTE — Anesthesia Procedure Notes (Signed)
Procedure Name: LMA Insertion Date/Time: 03/21/2015 7:27 AM Performed by: Gar Gibbon Pre-anesthesia Checklist: Patient identified, Emergency Drugs available, Suction available and Patient being monitored Patient Re-evaluated:Patient Re-evaluated prior to inductionOxygen Delivery Method: Circle System Utilized Preoxygenation: Pre-oxygenation with 100% oxygen Intubation Type: IV induction Ventilation: Mask ventilation without difficulty LMA: LMA inserted LMA Size: 4.0 Number of attempts: 1 Airway Equipment and Method: Bite block Placement Confirmation: positive ETCO2 Tube secured with: Tape Dental Injury: Teeth and Oropharynx as per pre-operative assessment

## 2015-03-21 NOTE — Anesthesia Preprocedure Evaluation (Signed)
Anesthesia Evaluation  Patient identified by MRN, date of birth, ID band Patient awake    Reviewed: Allergy & Precautions, NPO status , Patient's Chart, lab work & pertinent test results  Airway Mallampati: II  TM Distance: >3 FB Neck ROM: Full    Dental no notable dental hx. (+) Teeth Intact   Pulmonary Current Smoker,    Pulmonary exam normal breath sounds clear to auscultation       Cardiovascular negative cardio ROS Normal cardiovascular exam Rhythm:Regular Rate:Normal     Neuro/Psych Anxiety negative neurological ROS     GI/Hepatic negative GI ROS, Neg liver ROS,   Endo/Other  Left breast ductal hyperplasia Obesity  Renal/GU negative Renal ROS  negative genitourinary   Musculoskeletal negative musculoskeletal ROS (+)   Abdominal (+) + obese,   Peds  Hematology negative hematology ROS (+)   Anesthesia Other Findings   Reproductive/Obstetrics negative OB ROS                             Anesthesia Physical Anesthesia Plan  ASA: II  Anesthesia Plan: General   Post-op Pain Management:    Induction: Intravenous  Airway Management Planned: LMA  Additional Equipment:   Intra-op Plan:   Post-operative Plan: Extubation in OR  Informed Consent: I have reviewed the patients History and Physical, chart, labs and discussed the procedure including the risks, benefits and alternatives for the proposed anesthesia with the patient or authorized representative who has indicated his/her understanding and acceptance.   Dental advisory given  Plan Discussed with: CRNA, Anesthesiologist and Surgeon  Anesthesia Plan Comments:         Anesthesia Quick Evaluation

## 2015-03-21 NOTE — Discharge Instructions (Signed)
[facility] °Office Phone Number [phone] ° °BREAST BIOPSY/ PARTIAL MASTECTOMY: POST OP INSTRUCTIONS ° °Always review your discharge instruction sheet given to you by the facility where your surgery was performed. ° °IF YOU HAVE DISABILITY OR FAMILY LEAVE FORMS, YOU MUST BRING THEM TO THE OFFICE FOR PROCESSING.  DO NOT GIVE THEM TO YOUR DOCTOR. ° °1. A prescription for pain medication may be given to you upon discharge.  Take your pain medication as prescribed, if needed.  If narcotic pain medicine is not needed, then you may take acetaminophen (Tylenol) or ibuprofen (Advil) as needed. °2. Take your usually prescribed medications unless otherwise directed °3. If you need a refill on your pain medication, please contact your pharmacy.  They will contact our office to request authorization.  Prescriptions will not be filled after 5pm or on week-ends. °4. You should eat very light the first 24 hours after surgery, such as soup, crackers, pudding, etc.  Resume your normal diet the An[place] after surgery. °5. Most patients will experience some swelling and bruising in the breast.  Ice packs and a good support bra will help.  Swelling and bruising can take several days to resolve.  °6. It is common to experience some constipation if taking pain medication after surgery.  Increasing fluid intake and taking a stool softener will usually help or prevent this problem from occurring.  A mild laxative (Milk of Magnesia or Miralax) should be taken according to package directions if there are no bowel movements after 48 hours. °7. Unless discharge instructions indicate otherwise, you may remove your bandages 24-48 hours after surgery, and you may shower at that time.  You may have steri-strips (small skin tapes) in place directly over the incision.  These strips should be left on the skin for 7-10 days.  If your surgeon used skin glue on the incision, you may shower in 24 hours.  The glue will flake off over the  next 2-3 weeks.  Any sutures or staples will be removed at the office during your follow-up visit. °8. ACTIVITIES:  You may resume regular daily activities (gradually increasing) beginning the next [place].  Wearing a good support bra or sports bra minimizes pain and swelling.  You may have sexual intercourse when it is comfortable. °a. You may drive when you no longer are taking prescription pain medication, you can comfortably wear a seatbelt, and you can safely maneuver your car and apply brakes. °b. RETURN TO WORK:  ______________________________________________________________________________________ °9. You should see your doctor in the office for a follow-up appointment approximately two weeks after your surgery.  Your doctor’s nurse will typically make your follow-up appointment when she calls you with your pathology report.  Expect your pathology report 2-3 business days after your surgery.  You may call to check if you do not hear from us after three days. °10. OTHER INSTRUCTIONS: _______________________________________________________________________________________________ _____________________________________________________________________________________________________________________________________ °_____________________________________________________________________________________________________________________________________ °_____________________________________________________________________________________________________________________________________ ° °WHEN TO CALL YOUR DOCTOR: °1. Fever over 101.0 °2. Nausea and/or vomiting. °3. Extreme swelling or bruising. °4. Continued bleeding from incision. °5. Increased pain, redness, or drainage from the incision. ° °The clinic staff is available to answer your questions during regular business hours.  Please don’t hesitate to call and ask to speak to one of the nurses for clinical concerns.  If you have a medical emergency, go to the nearest  emergency room or call 911.  A surgeon from [facility] is always on call at the hospital. ° °For further questions, please visit [email or web address]  ° ° ° °  Post Anesthesia Home Care Instructions ° °Activity: °Get plenty of rest for the remainder of the [place]. A responsible adult should stay with you for 24 hours following the procedure.  °For the next 24 hours, DO NOT: °-Drive a car °-Operate machinery °-Drink alcoholic beverages °-Take any medication unless instructed by your physician °-Make any legal decisions or sign important papers. ° °Meals: °Start with liquid foods such as gelatin or soup. Progress to regular foods as tolerated. Avoid greasy, spicy, heavy foods. If nausea and/or vomiting occur, drink only clear liquids until the nausea and/or vomiting subsides. Call your physician if vomiting continues. ° °Special Instructions/Symptoms: °Your throat may feel dry or sore from the anesthesia or the breathing tube placed in your throat during surgery. If this causes discomfort, gargle with warm salt water. The discomfort should disappear within 24 hours. ° °If you had a scopolamine patch placed behind your ear for the management of post- operative nausea and/or vomiting: ° °1. The medication in the patch is effective for 72 hours, after which it should be removed.  Wrap patch in a tissue and discard in the trash. Wash hands thoroughly with soap and water. °2. You may remove the patch earlier than 72 hours if you experience unpleasant side effects which may include dry mouth, dizziness or visual disturbances. °3. Avoid touching the patch. Wash your hands with soap and water after contact with the patch. °  ° °

## 2015-03-22 ENCOUNTER — Encounter (HOSPITAL_BASED_OUTPATIENT_CLINIC_OR_DEPARTMENT_OTHER): Payer: Self-pay | Admitting: Surgery

## 2016-08-22 IMAGING — MG MM DIAG BREAST TOMO BILATERAL
11 of 14 series · 11 of 30 positions shown · non-contrast
Comparison: 11/30/2014 screening mammogram

CLINICAL DATA: 39-year-old female with possible right breast masses
and left breast calcifications identified on recent screening
mammogram.

EXAM:
DIGITAL DIAGNOSTIC BILATERAL MAMMOGRAM WITH 3D TOMOSYNTHESIS WITH
CAD
ULTRASOUND RIGHT BREAST

[L ML]
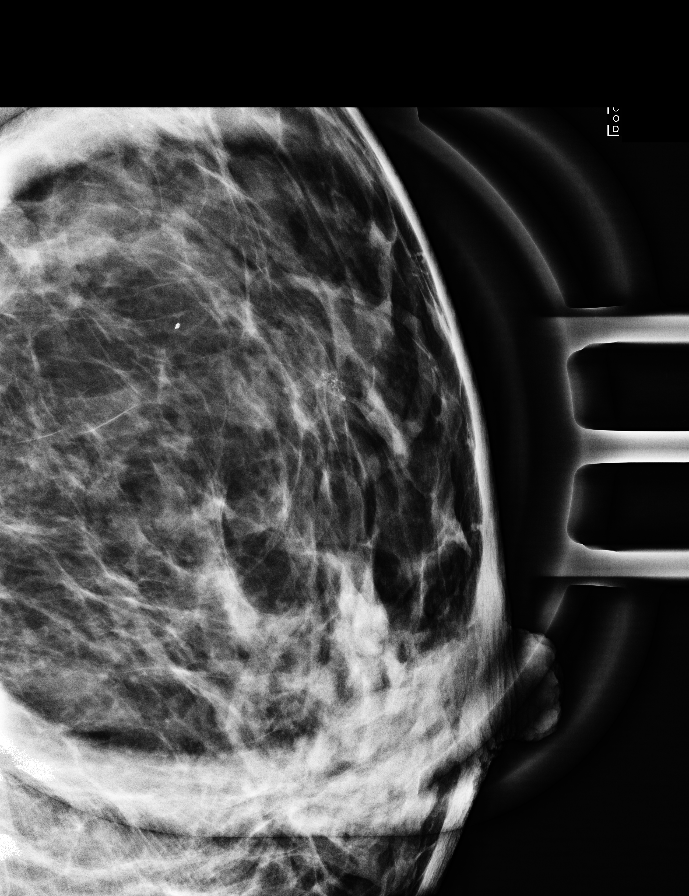

[L CC (1 of 2)]
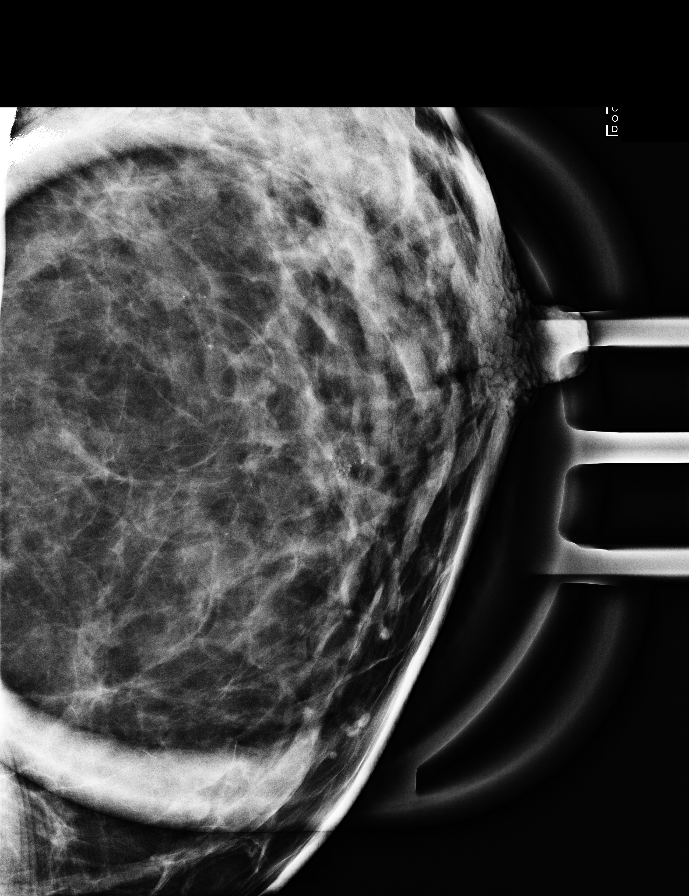

[L MLO]
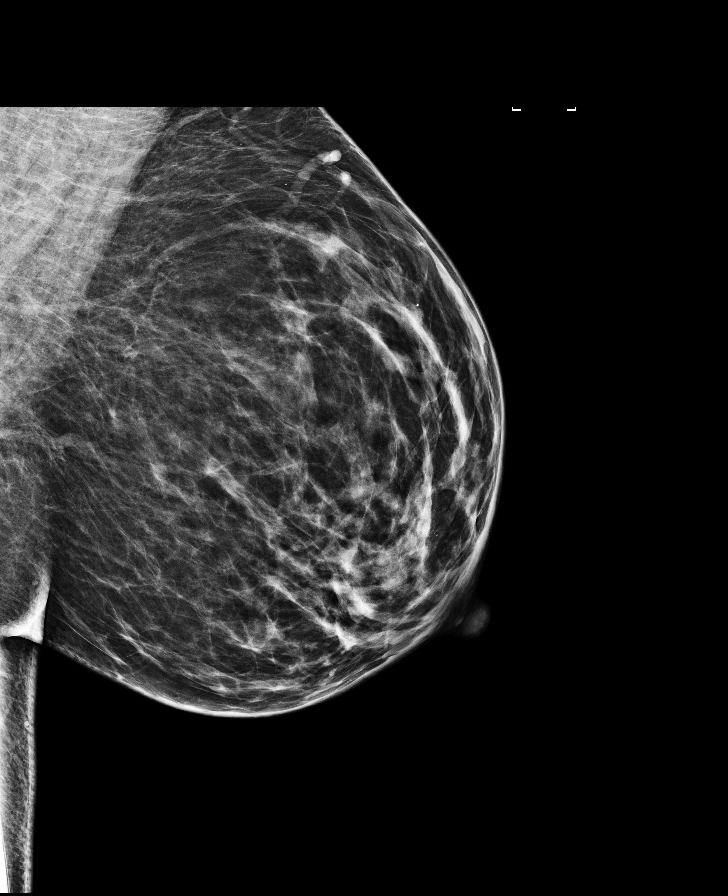

[L CC (2 of 2)]
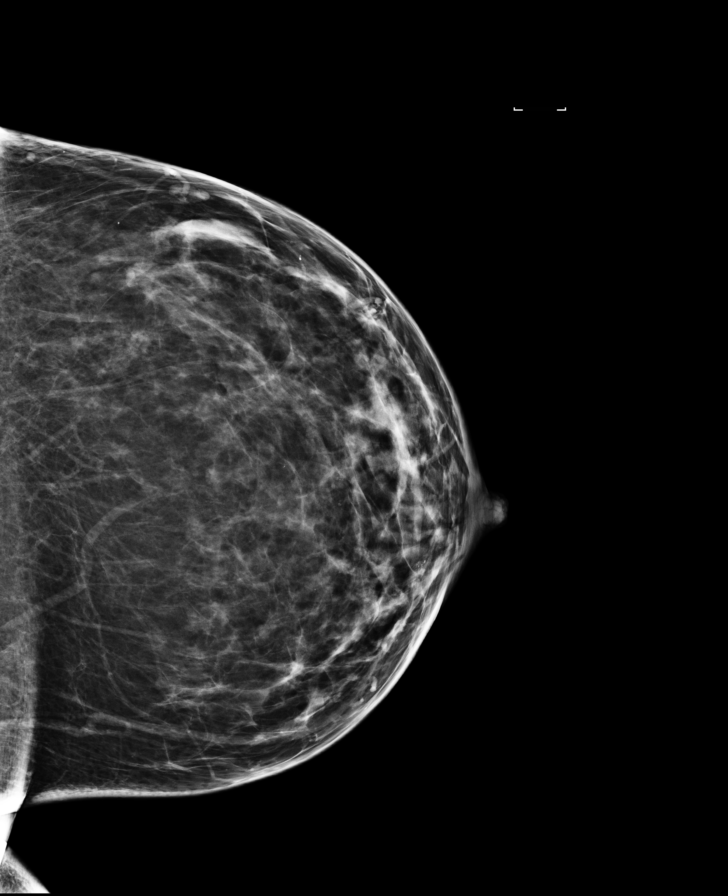

[R MLO]
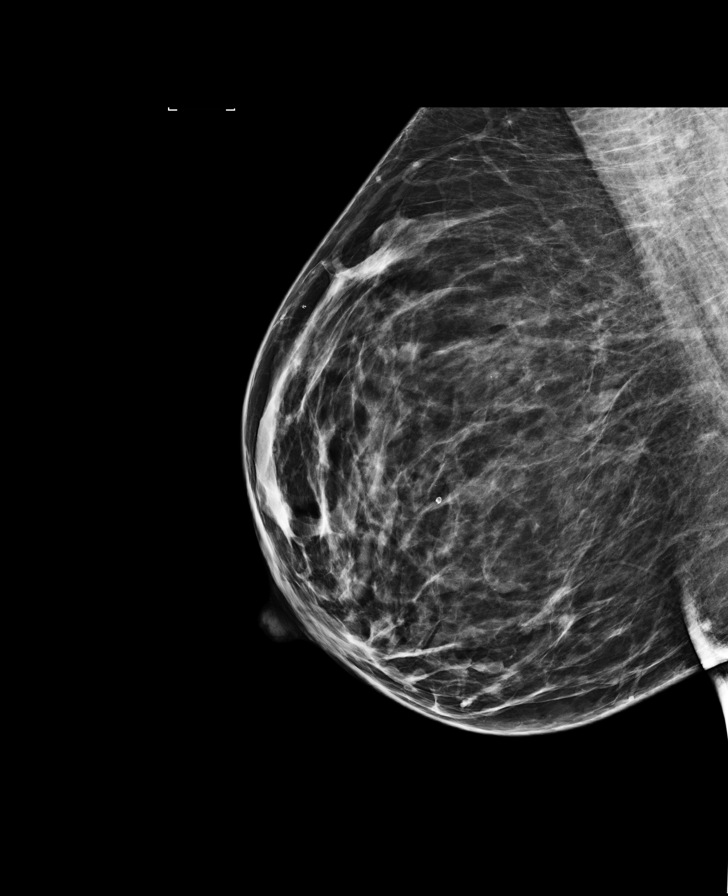

[R CC]
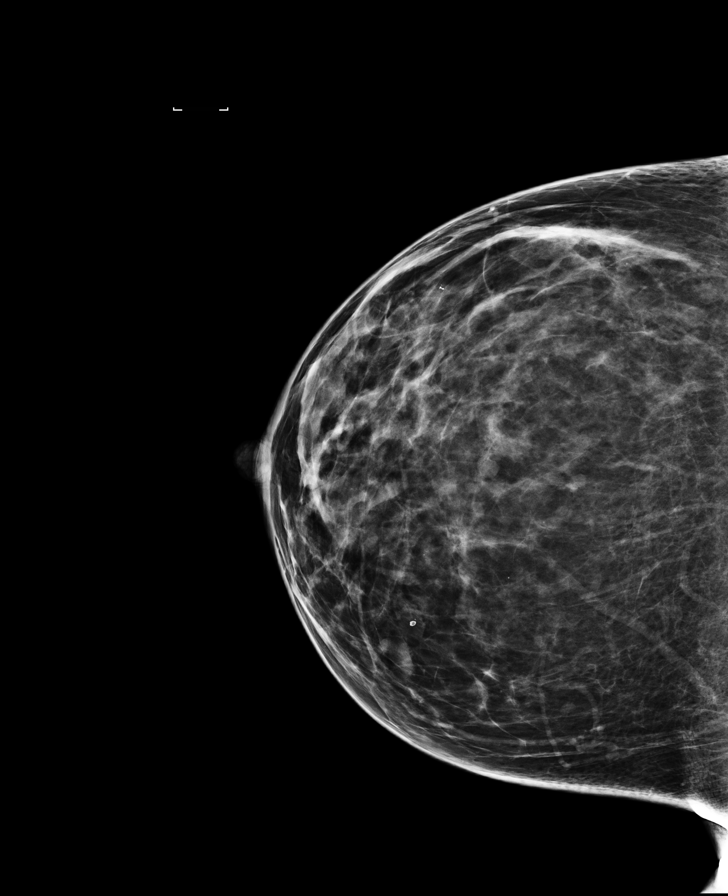

[L CC tomo]
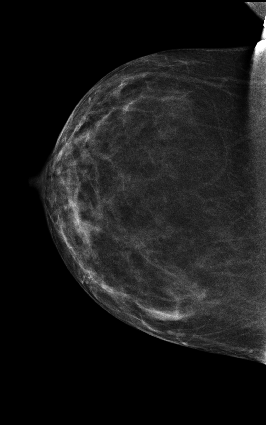

[R MLO tomo (1 of 2)]
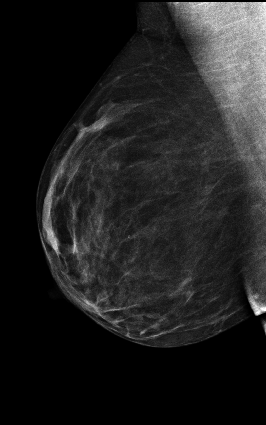

[R CC tomo]
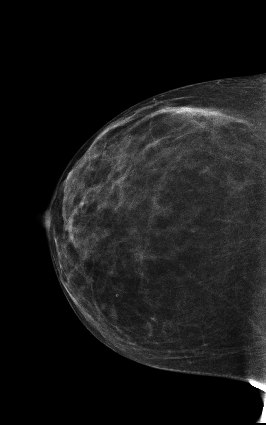

[L MLO tomo]
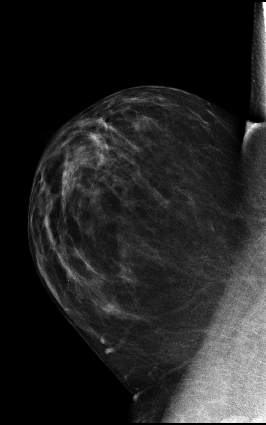

[R MLO tomo (2 of 2) · tomo slice 41/81.0]
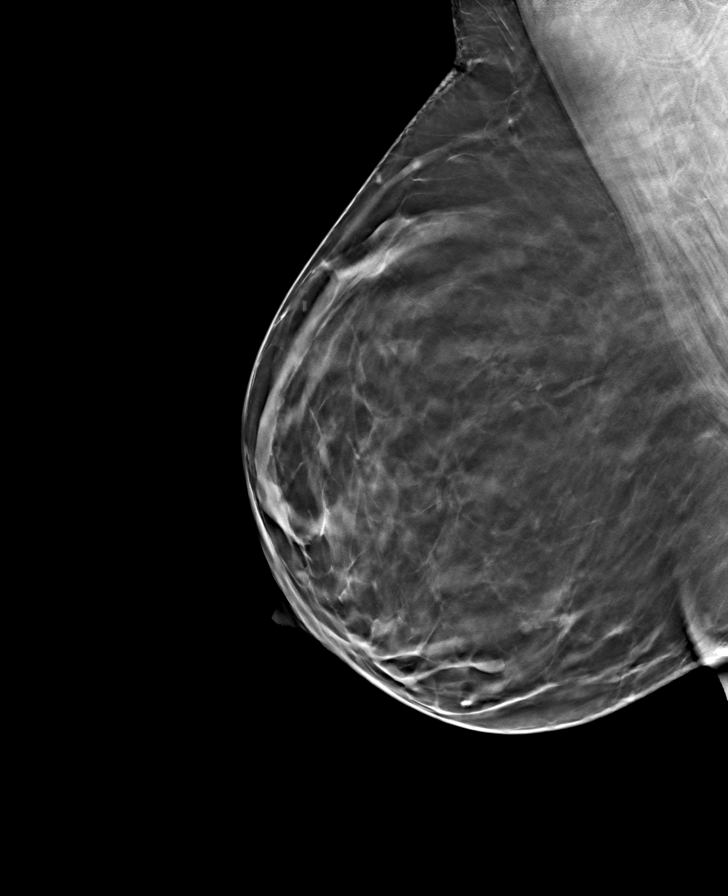

[11 of 30 positions shown; findings below may reference images not displayed]

ACR Breast Density Category b: There are scattered areas of
fibroglandular density.
FINDINGS: 2D and 3D views of both breast and most spot magnification views of
the left breast are performed.

Low-density circumscribed oval masses are identified within the
inner lower and central right breast.

A 6 mm group of heterogeneous calcifications is identified within
the anterior upper inner left breast. No associated mass or
distortion identified.

Mammographic images were processed with CAD.

On physical exam, no palpable abnormalities are identified within
the right breast.

Targeted ultrasound is performed, showing benign cysts within the
right breast. A 0.9 x 0.3 x 0.9 cm complicated cyst at the 5 o'clock
position 4 cm from the nipple and a 0.5 x 0.2 x 0.6 cm simple cyst
at the 10 o'clock position 4 cm from the nipple correspond to the
screening study findings.
IMPRESSION: 6 mm group of indeterminate left breast calcifications- tissue
sampling is recommended.

Benign cysts within the right breast corresponding to the screening
study findings.

RECOMMENDATION:
Stereotactic guided left breast biopsy, which has been scheduled for
12/12/2017 and the patient informed.

I have discussed the findings and recommendations with the patient.
Results were also provided in writing at the conclusion of the
visit. If applicable, a reminder letter will be sent to the patient
regarding the next appointment.

BI-RADS CATEGORY  4: Suspicious.

## 2018-04-25 ENCOUNTER — Other Ambulatory Visit: Payer: Self-pay | Admitting: Internal Medicine

## 2018-04-25 DIAGNOSIS — Z9889 Other specified postprocedural states: Secondary | ICD-10-CM

## 2019-04-20 ENCOUNTER — Encounter (HOSPITAL_COMMUNITY): Payer: Self-pay

## 2019-04-20 ENCOUNTER — Emergency Department (HOSPITAL_COMMUNITY): Payer: 59

## 2019-04-20 ENCOUNTER — Emergency Department (HOSPITAL_COMMUNITY)
Admission: EM | Admit: 2019-04-20 | Discharge: 2019-04-21 | Disposition: A | Discharge status: Home / Self Care | Payer: 59 | Attending: Emergency Medicine | Admitting: Emergency Medicine

## 2019-04-20 ENCOUNTER — Other Ambulatory Visit: Payer: Self-pay

## 2019-04-20 DIAGNOSIS — F17210 Nicotine dependence, cigarettes, uncomplicated: Secondary | ICD-10-CM | POA: Diagnosis not present

## 2019-04-20 DIAGNOSIS — Z79899 Other long term (current) drug therapy: Secondary | ICD-10-CM | POA: Diagnosis not present

## 2019-04-20 DIAGNOSIS — R0789 Other chest pain: Secondary | ICD-10-CM | POA: Diagnosis present

## 2019-04-20 DIAGNOSIS — R079 Chest pain, unspecified: Secondary | ICD-10-CM

## 2019-04-20 LAB — BASIC METABOLIC PANEL
Anion gap: 12 (ref 5–15)
BUN: 13 mg/dL (ref 6–20)
CO2: 23 mmol/L (ref 22–32)
Calcium: 9.1 mg/dL (ref 8.9–10.3)
Chloride: 103 mmol/L (ref 98–111)
Creatinine, Ser: 0.53 mg/dL (ref 0.44–1.00)
GFR calc Af Amer: >60 mL/min (ref >60)
GFR calc non Af Amer: >60 mL/min (ref >60)
Glucose, Bld: 153 mg/dL — ABNORMAL HIGH (ref 70–99)
Potassium: 4.1 mmol/L (ref 3.5–5.1)
Sodium: 138 mmol/L (ref 135–145)

## 2019-04-20 LAB — I-STAT BETA HCG BLOOD, ED (MC, WL, AP ONLY): I-stat hCG, quantitative: <5 m[IU]/mL (ref <5)

## 2019-04-20 LAB — CBC
HCT: 35.2 % — ABNORMAL LOW (ref 36.0–46.0)
Hemoglobin: 12 g/dL (ref 12.0–15.0)
MCH: 34.9 pg — ABNORMAL HIGH (ref 26.0–34.0)
MCHC: 34.1 g/dL (ref 30.0–36.0)
MCV: 102.3 fL — ABNORMAL HIGH (ref 80.0–100.0)
Platelets: 254 10*3/uL (ref 150–400)
RBC: 3.44 MIL/uL — ABNORMAL LOW (ref 3.87–5.11)
RDW: 16.1 % — ABNORMAL HIGH (ref 11.5–15.5)
WBC: 7.3 10*3/uL (ref 4.0–10.5)
nRBC: 0 % (ref 0.0–0.2)

## 2019-04-20 LAB — TROPONIN I (HIGH SENSITIVITY): Troponin I (High Sensitivity): 2 ng/L (ref <18)

## 2019-04-20 MED ORDER — SODIUM CHLORIDE 0.9% FLUSH
3.0000 mL | Freq: Once | INTRAVENOUS | Status: AC
  Administered 2019-04-20: 3 mL via INTRAVENOUS

## 2019-04-20 MED ORDER — LIDOCAINE VISCOUS HCL 2 % MT SOLN
15.0000 mL | Freq: Once | OROMUCOSAL | Status: AC
  Administered 2019-04-20: 15 mL via ORAL
  Filled 2019-04-20: qty 15

## 2019-04-20 MED ORDER — KETOROLAC TROMETHAMINE 15 MG/ML IJ SOLN
7.5000 mg | Freq: Once | INTRAMUSCULAR | Status: AC
  Administered 2019-04-20: 7.5 mg via INTRAVENOUS
  Filled 2019-04-20: qty 1

## 2019-04-20 MED ORDER — ALUM & MAG HYDROXIDE-SIMETH 200-200-20 MG/5ML PO SUSP
30.0000 mL | Freq: Once | ORAL | Status: AC
  Administered 2019-04-20: 30 mL via ORAL
  Filled 2019-04-20: qty 30

## 2019-04-20 NOTE — ED Triage Notes (Signed)
Pt presents with c/o chest pain and shortness of breath that started tonight. Pt also reports that when she began to experience these symptoms, her ankles started to swell as well. Pt reports tingling in her extremities.

## 2019-04-20 NOTE — ED Provider Notes (Signed)
[facility]-EMERGENCY DEPT Provider Note  CSN: [id number] Arrival date & time: 04/20/19 2140  Chief Complaint(s) Chest Pain and Shortness of Breath  HPI Miranda Lawson is a 44 y.o. female    Chest Pain Pain location:  R chest Pain quality: sharp and stabbing   Pain radiates to:  Does not radiate Pain severity:  Moderate Onset quality:  Gradual Duration:  2 hours Timing:  Constant Chronicity:  New Context: movement   Relieved by:  Nothing Worsened by:  Certain positions, movement and deep breathing Associated symptoms: back pain and shortness of breath   Associated symptoms: no anxiety, no cough, no fatigue, no fever, no headache, no heartburn, no nausea and no vomiting   Risk factors: obesity and smoking   Risk factors: no coronary artery disease, no diabetes mellitus, no high cholesterol, no hypertension, no immobilization, not female and no prior DVT/PE   Shortness of Breath Associated symptoms: chest pain   Associated symptoms: no cough, no fever, no headaches and no vomiting     Past Medical History Past Medical History:  Diagnosis Date  . Anxiety    There are no problems to display for this patient.  Home Medication(s) Prior to Admission medications   Medication Sig Start Date End Date Taking? Authorizing Provider  busPIRone (BUSPAR) 15 MG tablet Take 30 mg by mouth 2 (two) times daily. anxiety 04/16/14  Yes [provider]  loratadine (CLARITIN) 10 MG tablet Take 10 mg by mouth daily.   Yes [provider]                                                                                                                                    Past Surgical History Past Surgical History:  Procedure Laterality Date  . BREAST LUMPECTOMY WITH RADIOACTIVE SEED LOCALIZATION Left 03/21/2015   Procedure: LEFT BREAST LUMPECTOMY WITH RADIOACTIVE SEED LOCALIZATION;  Surgeon: Harriette Bouillon, MD;  Location: [facility];  Service: General;   Laterality: Left;  . CHOLECYSTECTOMY    . SINUS EXPLORATION  2002   Family History History reviewed. No pertinent family history.  Social History Social History   Tobacco Use  . Smoking status: Current Every Biondo Smoker    Packs/Colello: 0.25    Types: Cigarettes  Substance Use Topics  . Alcohol use: Yes    Comment: occasional  . Drug use: No   Allergies Patient has no known allergies.  Review of Systems Review of Systems  Constitutional: Negative for fatigue and fever.  Respiratory: Positive for shortness of breath. Negative for cough.   Cardiovascular: Positive for chest pain.  Gastrointestinal: Negative for heartburn, nausea and vomiting.  Musculoskeletal: Positive for back pain.  Neurological: Negative for headaches.   All other systems are reviewed and are negative for acute change except as noted in the HPI  Physical Exam Vital Signs  I have reviewed the triage vital signs BP (!) 165/94 (  BP Location: Left Arm)   Pulse 85   Temp 97.8 F (36.6 C) (Oral)   Resp 16   LMP 03/26/2019 (Approximate)   SpO2 97%   Physical Exam Vitals reviewed.  Constitutional:      General: She is not in acute distress.    Appearance: She is well-developed. She is not diaphoretic.  HENT:     Head: Normocephalic and atraumatic.     Nose: Nose normal.  Eyes:     General: No scleral icterus.       Right eye: No discharge.        Left eye: No discharge.     Conjunctiva/sclera: Conjunctivae normal.     Pupils: Pupils are equal, round, and reactive to light.  Cardiovascular:     Rate and Rhythm: Normal rate and regular rhythm.     Heart sounds: No murmur. No friction rub. No gallop.   Pulmonary:     Effort: Pulmonary effort is normal. No respiratory distress.     Breath sounds: Normal breath sounds. No stridor. No wheezing, rhonchi or rales.    Chest:     Chest wall: Tenderness present.    Abdominal:     General: There is no distension.     Palpations: Abdomen is soft.      Tenderness: There is no abdominal tenderness.  Musculoskeletal:        General: No tenderness.     Cervical back: Normal range of motion and neck supple.  Skin:    General: Skin is warm and dry.     Findings: No erythema or rash.  Neurological:     Mental Status: She is alert and oriented to person, place, and time.     ED Results and Treatments Labs (all labs ordered are listed, but only abnormal results are displayed) Labs Reviewed  BASIC METABOLIC PANEL - Abnormal; Notable for the following components:      Result Value   Glucose, Bld 153 (*)    All other components within normal limits  CBC - Abnormal; Notable for the following components:   RBC 3.44 (*)    HCT 35.2 (*)    MCV 102.3 (*)    MCH 34.9 (*)    RDW 16.1 (*)    All other components within normal limits  HEPATIC FUNCTION PANEL - Abnormal; Notable for the following components:   Alkaline Phosphatase 37 (*)    All other components within normal limits  LIPASE, BLOOD  D-DIMER, QUANTITATIVE (NOT AT [facility])  I-STAT BETA HCG BLOOD, ED ([facility], [facility], [facility] ONLY)  TROPONIN I (HIGH SENSITIVITY)  TROPONIN I (HIGH SENSITIVITY)                                                                                                                         EKG  EKG Interpretation  Date/Time:  Thursday April 20 2019 21:49:06 EST Ventricular Rate:  74 PR Interval:    QRS Duration: 102 QT Interval:  415 QTC  Calculation: 461 R Axis:   11 Text Interpretation: Sinus rhythm Low voltage, precordial leads Baseline wander in lead(s) II III aVL aVF No old tracing to compare Confirmed by Addison Lank [phone]) on 04/20/2019 11:02:54 PM      Radiology DG Chest 2 View  Result Date: 04/20/2019 CLINICAL DATA:  Chest pain and shortness of breath EXAM: CHEST - 2 VIEW COMPARISON:  None. FINDINGS: Hazy and streaky opacities towards the lung bases favor atelectasis in the absence of infectious symptoms. No consolidation, features of edema, pneumothorax,  or effusion. The cardiomediastinal contours are unremarkable. No acute osseous or soft tissue abnormality. Telemetry leads overlie the chest. IMPRESSION: Hazy and streaky opacities towards the lung bases favor atelectasis. No other acute cardiopulmonary abnormality. Electronically Signed   By: Lovena Le M.D.   On: 04/20/2019 22:21    Pertinent labs & imaging results that were available during my care of the patient were reviewed by me and considered in my medical decision making (see chart for details).  Medications Ordered in ED Medications  sodium chloride flush (NS) 0.9 % injection 3 mL (3 mLs Intravenous Given 04/20/19 2336)  alum & mag hydroxide-simeth (MAALOX/MYLANTA) 200-200-20 MG/5ML suspension 30 mL (30 mLs Oral Given 04/20/19 2344)    And  lidocaine (XYLOCAINE) 2 % viscous mouth solution 15 mL (15 mLs Oral Given 04/20/19 2344)  ketorolac (TORADOL) 15 MG/ML injection 7.5 mg (7.5 mg Intravenous Given 04/20/19 2345)  ketorolac (TORADOL) 15 MG/ML injection 15 mg (15 mg Intravenous Given 04/21/19 0159)                                                                                                                                    Procedures Procedures  (including critical care time)  Medical Decision Making / ED Course I have reviewed the nursing notes for this encounter and the patient's prior records (if available in EHR or on provided paperwork).   Miranda Lawson was evaluated in Emergency Department on 04/21/2019 for the symptoms described in the history of present illness. She was evaluated in the context of the global COVID-19 pandemic, which necessitated consideration that the patient might be at risk for infection with the SARS-CoV-2 virus that causes COVID-19. Institutional protocols and algorithms that pertain to the evaluation of patients at risk for COVID-19 are in a state of rapid change based on information released by regulatory bodies including the CDC and federal and state  organizations. These policies and algorithms were followed during the patient's care in the ED.  Atypical chest pain highly consistent with ACS.  EKG without acute ischemic changes or evidence of pericarditis.  Heart score less than 4.  Initial troponin negative.  Appropriate for delta troponin.  Second troponin negative  Presentation not classic for aortic dissection or esophageal perforation. Given pleuritic nature no extremity edema left greater than right, D-dimer obtained which was negative.  Low suspicion for PE.  Chest x-ray without  evidence suggestive of pneumonia, pneumothorax, pneumomediastinum.  No abnormal contour of the mediastinum to suggest dissection. No evidence of acute injuries.  Presentation is most consistent with muscle strain/spasm. Pain resolved with Toradol.      Final Clinical Impression(s) / ED Diagnoses Final diagnoses:  Right-sided chest pain   The patient appears reasonably screened and/or stabilized for discharge and I doubt any other medical condition or other [facility] requiring further screening, evaluation, or treatment in the ED at this time prior to discharge. Safe for discharge with strict return precautions.  Disposition: Discharge  Condition: Good  I have discussed the results, Dx and Tx plan with the patient/family who expressed understanding and agree(s) with the plan. Discharge instructions discussed at length. The patient/family was given strict return precautions who verbalized understanding of the instructions. No further questions at time of discharge.    ED Discharge Orders    None        Follow Up: Salli Real, MD [address] [place] [address] [phone]  Schedule an appointment as soon as possible for a visit  As needed      This chart was dictated using voice recognition software.  Despite best efforts to proofread,  errors can occur which can change the documentation meaning.   Nira Conn,  MD 04/21/19 [phone]

## 2019-04-21 LAB — TROPONIN I (HIGH SENSITIVITY): Troponin I (High Sensitivity): <2 ng/L (ref <18)

## 2019-04-21 LAB — HEPATIC FUNCTION PANEL
ALT: 38 U/L (ref 0–44)
AST: 30 U/L (ref 15–41)
Albumin: 4.2 g/dL (ref 3.5–5.0)
Alkaline Phosphatase: 37 U/L — ABNORMAL LOW (ref 38–126)
Bilirubin, Direct: 0.1 mg/dL (ref 0.0–0.2)
Indirect Bilirubin: 0.4 mg/dL (ref 0.3–0.9)
Total Bilirubin: 0.5 mg/dL (ref 0.3–1.2)
Total Protein: 7.1 g/dL (ref 6.5–8.1)

## 2019-04-21 LAB — LIPASE, BLOOD: Lipase: 44 U/L (ref 11–51)

## 2019-04-21 LAB — D-DIMER, QUANTITATIVE: D-Dimer, Quant: 0.33 ug/mL-FEU (ref 0.00–0.50)

## 2019-04-21 MED ORDER — KETOROLAC TROMETHAMINE 15 MG/ML IJ SOLN
15.0000 mg | Freq: Once | INTRAMUSCULAR | Status: AC
  Administered 2019-04-21: 15 mg via INTRAVENOUS
  Filled 2019-04-21: qty 1

## 2019-04-21 NOTE — Discharge Instructions (Signed)
You may use over-the-counter Motrin (Ibuprofen), Acetaminophen (Tylenol), topical muscle creams such as SalonPas, Icy Hot, Bengay, etc. Please stretch, apply heat, and have massage therapy for additional assistance. ° °

## 2019-06-07 ENCOUNTER — Ambulatory Visit: Payer: Self-pay | Admitting: *Deleted

## 2019-06-07 ENCOUNTER — Emergency Department (HOSPITAL_COMMUNITY)
Admission: EM | Admit: 2019-06-07 | Discharge: 2019-06-07 | Disposition: A | Discharge status: Home / Self Care | Payer: 59 | Attending: Emergency Medicine | Admitting: Emergency Medicine

## 2019-06-07 ENCOUNTER — Emergency Department (HOSPITAL_COMMUNITY): Payer: 59

## 2019-06-07 ENCOUNTER — Other Ambulatory Visit: Payer: Self-pay

## 2019-06-07 DIAGNOSIS — Z853 Personal history of malignant neoplasm of breast: Secondary | ICD-10-CM | POA: Diagnosis not present

## 2019-06-07 DIAGNOSIS — R531 Weakness: Secondary | ICD-10-CM | POA: Diagnosis not present

## 2019-06-07 DIAGNOSIS — R2243 Localized swelling, mass and lump, lower limb, bilateral: Secondary | ICD-10-CM | POA: Diagnosis not present

## 2019-06-07 DIAGNOSIS — R200 Anesthesia of skin: Secondary | ICD-10-CM | POA: Diagnosis not present

## 2019-06-07 DIAGNOSIS — R739 Hyperglycemia, unspecified: Secondary | ICD-10-CM | POA: Insufficient documentation

## 2019-06-07 DIAGNOSIS — F17210 Nicotine dependence, cigarettes, uncomplicated: Secondary | ICD-10-CM | POA: Diagnosis not present

## 2019-06-07 LAB — URINALYSIS, ROUTINE W REFLEX MICROSCOPIC
Bilirubin Urine: NEGATIVE
Glucose, UA: >=500 mg/dL — AB
Hgb urine dipstick: NEGATIVE
Ketones, ur: 5 mg/dL — AB
Leukocytes,Ua: NEGATIVE
Nitrite: NEGATIVE
Protein, ur: NEGATIVE mg/dL
Specific Gravity, Urine: 1.029 (ref 1.005–1.030)
pH: 5 (ref 5.0–8.0)

## 2019-06-07 LAB — CBG MONITORING, ED
Glucose-Capillary: 349 mg/dL — ABNORMAL HIGH (ref 70–99)
Glucose-Capillary: 344 mg/dL — ABNORMAL HIGH (ref 70–99)

## 2019-06-07 LAB — BASIC METABOLIC PANEL
Anion gap: 11 (ref 5–15)
BUN: 20 mg/dL (ref 6–20)
CO2: 22 mmol/L (ref 22–32)
Calcium: 9 mg/dL (ref 8.9–10.3)
Chloride: 101 mmol/L (ref 98–111)
Creatinine, Ser: 0.62 mg/dL (ref 0.44–1.00)
GFR calc Af Amer: >60 mL/min (ref >60)
GFR calc non Af Amer: >60 mL/min (ref >60)
Glucose, Bld: 351 mg/dL — ABNORMAL HIGH (ref 70–99)
Potassium: 4.3 mmol/L (ref 3.5–5.1)
Sodium: 134 mmol/L — ABNORMAL LOW (ref 135–145)

## 2019-06-07 LAB — CBC
HCT: 35.9 % — ABNORMAL LOW (ref 36.0–46.0)
Hemoglobin: 12.1 g/dL (ref 12.0–15.0)
MCH: 36.3 pg — ABNORMAL HIGH (ref 26.0–34.0)
MCHC: 33.7 g/dL (ref 30.0–36.0)
MCV: 107.8 fL — ABNORMAL HIGH (ref 80.0–100.0)
Platelets: 241 10*3/uL (ref 150–400)
RBC: 3.33 MIL/uL — ABNORMAL LOW (ref 3.87–5.11)
RDW: 15.1 % (ref 11.5–15.5)
WBC: 5.5 10*3/uL (ref 4.0–10.5)
nRBC: 0.4 % — ABNORMAL HIGH (ref 0.0–0.2)

## 2019-06-07 LAB — MAGNESIUM: Magnesium: 2.2 mg/dL (ref 1.7–2.4)

## 2019-06-07 MED ORDER — METFORMIN HCL 500 MG PO TABS: 500.0000 mg | ORAL_TABLET | Freq: Two times a day (BID) | ORAL | 0 refills | Status: AC

## 2019-06-07 MED ORDER — SODIUM CHLORIDE 0.9 % IV BOLUS
1000.0000 mL | Freq: Once | INTRAVENOUS | Status: AC
  Administered 2019-06-07: 1000 mL via INTRAVENOUS

## 2019-06-07 MED ORDER — METFORMIN HCL 500 MG PO TABS
500.0000 mg | ORAL_TABLET | Freq: Once | ORAL | Status: AC
  Administered 2019-06-07: 14:00:00 500 mg via ORAL
  Filled 2019-06-07: qty 1

## 2019-06-07 MED ORDER — LORAZEPAM 2 MG/ML IJ SOLN
1.0000 mg | Freq: Once | INTRAMUSCULAR | Status: AC
  Administered 2019-06-07: 1 mg via INTRAVENOUS
  Filled 2019-06-07: qty 1

## 2019-06-07 NOTE — ED Notes (Signed)
Pt complaint of worsening leg weakness, worse on right since the beginning of the week. Pt verbalizes worse to the point could not get up/walk this AM. Pt verbalizes that she has some numbness to both lower extremities but worse on right. Bilateral lower leg swelling noted upon assessment.

## 2019-06-07 NOTE — ED Notes (Signed)
CBG - 349 

## 2019-06-07 NOTE — ED Triage Notes (Signed)
Pt arrives from home via [facility], per EMS, with c/o weakness in both of her legs, hx of neuropathy for about 2 months. Over the past two days, she has had increased difficulty walking, requiring assistance to walk now. She went to her doctor yesterday, they drew blood and told them they would get back with her. CBG 417. Right foot is more swollen and darker than the left. Decreased sensation to bottoms of the foot. Hr 70, 115/60, 98% RA.

## 2019-06-07 NOTE — ED Notes (Signed)
Pt transported to MRI 

## 2019-06-07 NOTE — Discharge Instructions (Signed)
As discussed, you have been diagnosed with hyperglycemia.  It is very important that you follow-up with your physician in the coming days for appropriate ongoing management.  Please take all medication as prescribed, and do not hesitate to return here if you develop new, or concerning changes in your condition.

## 2019-06-07 NOTE — Telephone Encounter (Signed)
Patient calling stating "I can't use my  legs even with walker. Don't feel safe feel like I am going to fall down and hurt myself. Pain in feet and hands are tingling.I am not able to stand or walk. I am not able to transfer from a wheelchair to use the bathroom"  Patient states that symptoms are worse from when she was in the ED. Patient was seen and discharge from the ED today. Pt advised to return to the ED and to follow up with PCP after ED visit. Understanding verbalized.   Reason for Disposition . [1] SEVERE weakness (i.e., unable to walk or barely able to walk, requires support) AND [2] new onset or worsening  Answer Assessment - Initial Assessment Questions 1. DESCRIPTION: "Describe how you are feeling."     Patient states that she can't even use her legs with the walker and does not feel safe 2. SEVERITY: "How bad is it?"  "Can you stand and walk?"   - MILD - Feels weak or tired, but does not interfere with work, school or normal activities   - MODERATE - Able to stand and walk; weakness interferes with work, school, or normal activities   - SEVERE - Unable to stand or walk     severe 3. ONSET:  "When did the weakness begin?"     Was seen and discharged from the emergency room today 4. CAUSE: "What do you think is causing the weakness?"     unsure 5. MEDICINES: "Have you recently started a new medicine or had a change in the amount of a medicine?"     Not assessed 6. OTHER SYMPTOMS: "Do you have any other symptoms?" (e.g., chest pain, fever, cough, SOB, vomiting, diarrhea, bleeding, other areas of pain)   No other symptoms voiced 7. PREGNANCY: "Is there any chance you are pregnant?" "When was your last menstrual period?"     n/a  Protocols used: WEAKNESS (GENERALIZED) AND FATIGUE-A-AH

## 2019-06-07 NOTE — ED Provider Notes (Signed)
[facility]-EMERGENCY DEPT Provider Note   CSN: [id number] Arrival date & time: 06/07/19  0530     History No chief complaint on file.   Miranda Lawson is a 44 y.o. female.  HPI   Patient presents with concern of numbness in her legs, as well as swelling.  Onset was about 2 months ago, but over the past 2 days or so she has developed increasing difficulty with ambulation.  In spite of using assistive devices, she notes a heaviness in the right leg greater than left.  No confusion, disorientation, speech difficulty, complete loss of sensation anywhere. She does have no history of diabetes, but has had recent Accu-Chek that was elevated, saw her physician yesterday, had blood test sent, but is not yet aware of her results. She has a history of breast cancer several years ago, but otherwise is generally well.    Past Medical History:  Diagnosis Date  . Anxiety     There are no problems to display for this patient.   Past Surgical History:  Procedure Laterality Date  . BREAST LUMPECTOMY WITH RADIOACTIVE SEED LOCALIZATION Left 03/21/2015   Procedure: LEFT BREAST LUMPECTOMY WITH RADIOACTIVE SEED LOCALIZATION;  Surgeon: Harriette Bouillon, MD;  Location: [facility];  Service: General;  Laterality: Left;  . CHOLECYSTECTOMY    . SINUS EXPLORATION  2002     OB History   No obstetric history on file.     No family history on file.  Social History   Tobacco Use  . Smoking status: Current Every Hitt Smoker    Packs/Calarco: 0.25    Types: Cigarettes  Substance Use Topics  . Alcohol use: Yes    Comment: occasional  . Drug use: No    Home Medications Prior to Admission medications   Medication Sig Start Date End Date Taking? Authorizing Provider  amoxicillin-clavulanate (AUGMENTIN) 875-125 MG tablet Take 1 tablet by mouth 2 (two) times daily. 7 [place] supply 06/01/19  Yes [provider]  escitalopram (LEXAPRO) 10 MG tablet Take 10 mg by mouth  daily. 06/02/19  Yes [provider]  lisinopril-hydrochlorothiazide (ZESTORETIC) 20-12.5 MG tablet Take 1 tablet by mouth daily. 05/25/19  Yes [provider]  neomycin-bacitracin-polymyxin (NEOSPORIN) ointment Apply 1 application topically as needed for wound care (toe).   Yes [provider]  pregabalin (LYRICA) 50 MG capsule Take 50 mg by mouth 3 (three) times daily. 05/16/19  Yes [provider]    Allergies    Patient has no known allergies.  Review of Systems   Review of Systems  Constitutional:       Per HPI, otherwise negative  HENT:       Per HPI, otherwise negative  Respiratory:       Per HPI, otherwise negative  Cardiovascular:       Per HPI, otherwise negative  Gastrointestinal: Negative for vomiting.  Endocrine:       Negative aside from HPI  Genitourinary:       Neg aside from HPI   Musculoskeletal:       Per HPI, otherwise negative  Skin: Negative.   Neurological: Positive for weakness and numbness. Negative for syncope.    Physical Exam Updated Vital Signs BP 109/74   Pulse 60   Temp 98 F (36.7 C) (Oral)   Resp 13   Ht 5\' 9"  (1.753 m)   Wt 104.3 kg   SpO2 97%   BMI 33.97 kg/m   Physical Exam Vitals and nursing  note reviewed.  Constitutional:      General: She is not in acute distress.    Appearance: She is well-developed. She is obese.  HENT:     Head: Normocephalic and atraumatic.  Eyes:     Conjunctiva/sclera: Conjunctivae normal.  Cardiovascular:     Rate and Rhythm: Normal rate and regular rhythm.  Pulmonary:     Effort: Pulmonary effort is normal. No respiratory distress.     Breath sounds: Normal breath sounds. No stridor.  Abdominal:     General: There is no distension.  Musculoskeletal:        General: No deformity.     Right lower leg: Edema present.     Left lower leg: Edema present.  Skin:    General: Skin is warm and dry.  Neurological:     Mental Status: She is alert and oriented to person,  place, and time.     Cranial Nerves: No cranial nerve deficit.     Comments: Patient has no facial asymmetry, speech is clear, lower extremity strength is slightly asymmetric subjectively, with 5/5 strength in the right distally, proximally, 5/5 left proximal and distal.  Sensation intact throughout.     ED Results / Procedures / Treatments   Labs (all labs ordered are listed, but only abnormal results are displayed) Labs Reviewed  BASIC METABOLIC PANEL - Abnormal; Notable for the following components:      Result Value   Sodium 134 (*)    Glucose, Bld 351 (*)    All other components within normal limits  CBC - Abnormal; Notable for the following components:   RBC 3.33 (*)    HCT 35.9 (*)    MCV 107.8 (*)    MCH 36.3 (*)    nRBC 0.4 (*)    All other components within normal limits  URINALYSIS, ROUTINE W REFLEX MICROSCOPIC - Abnormal; Notable for the following components:   Glucose, UA >=500 (*)    Ketones, ur 5 (*)    Bacteria, UA RARE (*)    All other components within normal limits  CBG MONITORING, ED - Abnormal; Notable for the following components:   Glucose-Capillary 349 (*)    All other components within normal limits  CBG MONITORING, ED - Abnormal; Notable for the following components:   Glucose-Capillary 344 (*)    All other components within normal limits  MAGNESIUM  PREGNANCY, URINE  HEMOGLOBIN A1C    Radiology MR BRAIN WO CONTRAST  Result Date: 06/07/2019 CLINICAL DATA:  Focal neuro deficit, greater than 6 hours, stroke suspected. Additional history provided: Right lower extremity weakness. EXAM: MRI HEAD WITHOUT CONTRAST TECHNIQUE: Multiplanar, multiecho pulse sequences of the brain and surrounding structures were obtained without intravenous contrast. COMPARISON:  No pertinent prior studies available for comparison. FINDINGS: Brain: Multiple sequences are motion degraded. Most notably, there is moderate motion degradation of the axial T2 FLAIR sequence, moderate  motion degradation axial T1 weighted sequence and moderate motion degradation of the coronal T2 weighted sequence. There is no evidence of acute infarct. No evidence of intracranial mass. No midline shift or extra-axial fluid collection. No chronic intracranial blood products. There is no significant white matter disease. Cerebral volume is normal for age. Vascular: Flow voids maintained within the proximal large arterial vessels. Skull and upper cervical spine: No focal marrow lesion Sinuses/Orbits: Visualized orbits demonstrate no acute abnormality. Mild ethmoid sinus mucosal thickening. No significant mastoid effusion. IMPRESSION: Motion degraded examination as described. Unremarkable noncontrast MRI appearance of the brain. No evidence  of acute intracranial abnormality. Mild ethmoid sinus mucosal thickening. Electronically Signed   By: Kellie Simmering DO   On: 06/07/2019 11:28    Procedures Procedures (including critical care time)  Medications Ordered in ED Medications  sodium chloride 0.9 % bolus 1,000 mL (1,000 mLs Intravenous New Bag/Given 06/07/19 4132)    ED Course  I have reviewed the triage vital signs and the nursing notes.  Pertinent labs & imaging results that were available during my care of the patient were reviewed by me and considered in my medical decision making (see chart for details).   Update:, Patient has returned from the MRI, she is sleeping, snoring, not able to be awakened. 1:37 PM Patient awake and alert.  We discussed findings including reassuring MRI.  We discussed labs again, notable for hyperglycemia without anion gap.  Hemoglobin A1c has been sent.   This adult female presents with concern of hyperglycemia and lower extremity dysesthesia. Patient is awake, alert, moving extremities spontaneously has essentially unremarkable neurologic exam, though she describes subjective weakness in the right leg.  She has no low back pain, suggesting radiculopathy, and no  history of surgery in her back, no fever or leukocytosis suggesting infectious etiology.  With consideration of her weakness MRI was performed, this was reassuring.  Symptoms likely related to the patient's hyperglycemia, of unknown duration. Patient amenable to initiation of antihyperglycemic, outpatient PT referral, following up with primary care.  Final Clinical Impression(s) / ED Diagnoses Final diagnoses:  Hyperglycemia    Rx / DC Orders ED Discharge Orders         Ordered    metFORMIN (GLUCOPHAGE) 500 MG tablet  2 times daily with meals     06/07/19 [place]     06/07/19 1347    Face-to-face encounter (required for Medicare/Medicaid patients)    Comments: I Carmin Muskrat certify that this patient is under my care and that I, or a nurse practitioner or physician's assistant working with me, had a face-to-face encounter that meets the physician face-to-face encounter requirements with this patient on 06/07/2019. The encounter with the patient was in whole, or in part for the following medical condition(s) which is the primary reason for home health care (List medical condition): additional orders per PMD   06/07/19 1347           Carmin Muskrat, MD 06/07/19 1348

## 2019-06-07 NOTE — ED Triage Notes (Signed)
Pt reporting pain and weakness in her legs, says they have not officially said she is diabetic, not taking any mediations at home for the same.

## 2019-06-08 LAB — HEMOGLOBIN A1C
Hgb A1c MFr Bld: 9.9 % — ABNORMAL HIGH (ref 4.8–5.6)
Mean Plasma Glucose: 237 mg/dL

## 2019-09-11 ENCOUNTER — Other Ambulatory Visit: Payer: Self-pay | Admitting: Physician Assistant

## 2019-09-11 DIAGNOSIS — Z1231 Encounter for screening mammogram for malignant neoplasm of breast: Secondary | ICD-10-CM

## 2019-09-13 ENCOUNTER — Ambulatory Visit
Admission: RE | Admit: 2019-09-13 | Discharge: 2019-09-13 | Disposition: A | Discharge status: Home / Self Care | Payer: 59 | Source: Ambulatory Visit | Attending: Physician Assistant | Admitting: Physician Assistant

## 2019-09-13 ENCOUNTER — Other Ambulatory Visit: Payer: Self-pay

## 2019-09-13 DIAGNOSIS — Z1231 Encounter for screening mammogram for malignant neoplasm of breast: Secondary | ICD-10-CM

## 2019-09-15 ENCOUNTER — Other Ambulatory Visit: Payer: Self-pay | Admitting: Physician Assistant

## 2019-09-15 DIAGNOSIS — R928 Other abnormal and inconclusive findings on diagnostic imaging of breast: Secondary | ICD-10-CM

## 2019-09-27 ENCOUNTER — Ambulatory Visit
Admission: RE | Admit: 2019-09-27 | Discharge: 2019-09-27 | Disposition: A | Discharge status: Home / Self Care | Payer: 59 | Source: Ambulatory Visit | Attending: Physician Assistant | Admitting: Physician Assistant

## 2019-09-27 ENCOUNTER — Other Ambulatory Visit: Payer: Self-pay | Admitting: Physician Assistant

## 2019-09-27 ENCOUNTER — Other Ambulatory Visit: Payer: Self-pay

## 2019-09-27 DIAGNOSIS — R928 Other abnormal and inconclusive findings on diagnostic imaging of breast: Secondary | ICD-10-CM

## 2019-09-27 DIAGNOSIS — R921 Mammographic calcification found on diagnostic imaging of breast: Secondary | ICD-10-CM

## 2019-09-29 ENCOUNTER — Ambulatory Visit
Admission: RE | Admit: 2019-09-29 | Discharge: 2019-09-29 | Disposition: A | Discharge status: Home / Self Care | Payer: 59 | Source: Ambulatory Visit | Attending: Physician Assistant | Admitting: Physician Assistant

## 2019-09-29 ENCOUNTER — Ambulatory Visit
Admission: RE | Admit: 2019-09-29 | Discharge: 2019-09-29 | Disposition: A | Discharge status: Home / Self Care | Payer: No Typology Code available for payment source | Source: Ambulatory Visit | Attending: Physician Assistant | Admitting: Physician Assistant

## 2019-09-29 ENCOUNTER — Other Ambulatory Visit: Payer: Self-pay

## 2019-09-29 DIAGNOSIS — R921 Mammographic calcification found on diagnostic imaging of breast: Secondary | ICD-10-CM

## 2020-04-12 ENCOUNTER — Encounter (HOSPITAL_COMMUNITY)
Admission: EM | Disposition: A | Discharge status: Home / Self Care | Payer: Self-pay | Source: Home / Self Care | Attending: Emergency Medicine

## 2020-04-12 ENCOUNTER — Other Ambulatory Visit: Payer: Self-pay

## 2020-04-12 ENCOUNTER — Observation Stay (HOSPITAL_COMMUNITY): Payer: 59 | Admitting: Anesthesiology

## 2020-04-12 ENCOUNTER — Emergency Department (HOSPITAL_COMMUNITY): Payer: 59

## 2020-04-12 ENCOUNTER — Observation Stay (HOSPITAL_COMMUNITY): Payer: 59

## 2020-04-12 ENCOUNTER — Encounter (HOSPITAL_COMMUNITY): Payer: Self-pay | Admitting: *Deleted

## 2020-04-12 ENCOUNTER — Observation Stay (HOSPITAL_COMMUNITY)
Admission: EM | Admit: 2020-04-12 | Discharge: 2020-04-13 | Disposition: A | Discharge status: Home / Self Care | Payer: 59 | Attending: Urology | Admitting: Urology

## 2020-04-12 DIAGNOSIS — E119 Type 2 diabetes mellitus without complications: Secondary | ICD-10-CM | POA: Insufficient documentation

## 2020-04-12 DIAGNOSIS — N201 Calculus of ureter: Secondary | ICD-10-CM | POA: Diagnosis not present

## 2020-04-12 DIAGNOSIS — Z7984 Long term (current) use of oral hypoglycemic drugs: Secondary | ICD-10-CM | POA: Insufficient documentation

## 2020-04-12 DIAGNOSIS — Z87891 Personal history of nicotine dependence: Secondary | ICD-10-CM | POA: Insufficient documentation

## 2020-04-12 DIAGNOSIS — Z79899 Other long term (current) drug therapy: Secondary | ICD-10-CM | POA: Insufficient documentation

## 2020-04-12 DIAGNOSIS — Z7982 Long term (current) use of aspirin: Secondary | ICD-10-CM | POA: Insufficient documentation

## 2020-04-12 DIAGNOSIS — Z20822 Contact with and (suspected) exposure to covid-19: Secondary | ICD-10-CM | POA: Diagnosis not present

## 2020-04-12 DIAGNOSIS — N390 Urinary tract infection, site not specified: Secondary | ICD-10-CM | POA: Diagnosis not present

## 2020-04-12 DIAGNOSIS — N209 Urinary calculus, unspecified: Secondary | ICD-10-CM | POA: Diagnosis present

## 2020-04-12 DIAGNOSIS — N200 Calculus of kidney: Secondary | ICD-10-CM

## 2020-04-12 DIAGNOSIS — R1031 Right lower quadrant pain: Secondary | ICD-10-CM | POA: Diagnosis present

## 2020-04-12 HISTORY — PX: CYSTOSCOPY WITH RETROGRADE PYELOGRAM, URETEROSCOPY AND STENT PLACEMENT: SHX5789

## 2020-04-12 HISTORY — DX: Urinary tract infection, site not specified: N39.0

## 2020-04-12 HISTORY — DX: Type 2 diabetes mellitus without complications: E11.9

## 2020-04-12 LAB — COMPREHENSIVE METABOLIC PANEL
ALT: 24 U/L (ref 0–44)
AST: 15 U/L (ref 15–41)
Albumin: 3.7 g/dL (ref 3.5–5.0)
Alkaline Phosphatase: 58 U/L (ref 38–126)
Anion gap: 11 (ref 5–15)
BUN: 14 mg/dL (ref 6–20)
CO2: 21 mmol/L — ABNORMAL LOW (ref 22–32)
Calcium: 9.3 mg/dL (ref 8.9–10.3)
Chloride: 102 mmol/L (ref 98–111)
Creatinine, Ser: 0.77 mg/dL (ref 0.44–1.00)
GFR, Estimated: >60 mL/min (ref >60)
Glucose, Bld: 236 mg/dL — ABNORMAL HIGH (ref 70–99)
Potassium: 3.7 mmol/L (ref 3.5–5.1)
Sodium: 134 mmol/L — ABNORMAL LOW (ref 135–145)
Total Bilirubin: 0.5 mg/dL (ref 0.3–1.2)
Total Protein: 7 g/dL (ref 6.5–8.1)

## 2020-04-12 LAB — CBC WITH DIFFERENTIAL/PLATELET
Abs Immature Granulocytes: 0.07 10*3/uL (ref 0.00–0.07)
Basophils Absolute: 0 10*3/uL (ref 0.0–0.1)
Basophils Relative: 0 %
Eosinophils Absolute: 0.1 10*3/uL (ref 0.0–0.5)
Eosinophils Relative: 0 %
HCT: 35 % — ABNORMAL LOW (ref 36.0–46.0)
Hemoglobin: 11.8 g/dL — ABNORMAL LOW (ref 12.0–15.0)
Immature Granulocytes: 0 %
Lymphocytes Relative: 12 %
Lymphs Abs: 2 10*3/uL (ref 0.7–4.0)
MCH: 30.4 pg (ref 26.0–34.0)
MCHC: 33.7 g/dL (ref 30.0–36.0)
MCV: 90.2 fL (ref 80.0–100.0)
Monocytes Absolute: 1 10*3/uL (ref 0.1–1.0)
Monocytes Relative: 7 %
Neutro Abs: 12.7 10*3/uL — ABNORMAL HIGH (ref 1.7–7.7)
Neutrophils Relative %: 81 %
Platelets: 215 10*3/uL (ref 150–400)
RBC: 3.88 MIL/uL (ref 3.87–5.11)
RDW: 13.6 % (ref 11.5–15.5)
WBC: 15.8 10*3/uL — ABNORMAL HIGH (ref 4.0–10.5)
nRBC: 0 % (ref 0.0–0.2)

## 2020-04-12 LAB — URINALYSIS, ROUTINE W REFLEX MICROSCOPIC
Bilirubin Urine: NEGATIVE
Glucose, UA: NEGATIVE mg/dL
Ketones, ur: NEGATIVE mg/dL
Nitrite: NEGATIVE
Protein, ur: NEGATIVE mg/dL
Specific Gravity, Urine: 1.01 (ref 1.005–1.030)
WBC, UA: >50 WBC/hpf — ABNORMAL HIGH (ref 0–5)
pH: 6 (ref 5.0–8.0)

## 2020-04-12 LAB — GLUCOSE, CAPILLARY
Glucose-Capillary: 141 mg/dL — ABNORMAL HIGH (ref 70–99)
Glucose-Capillary: 171 mg/dL — ABNORMAL HIGH (ref 70–99)

## 2020-04-12 LAB — SARS CORONAVIRUS 2 BY RT PCR (HOSPITAL ORDER, PERFORMED IN ~~LOC~~ HOSPITAL LAB): SARS Coronavirus 2: NEGATIVE

## 2020-04-12 SURGERY — CYSTOURETEROSCOPY, WITH RETROGRADE PYELOGRAM AND STENT INSERTION
Anesthesia: General | Laterality: Right

## 2020-04-12 MED ORDER — ADULT MULTIVITAMIN W/MINERALS CH
1.0000 | ORAL_TABLET | Freq: Every day | ORAL | Status: DC
  Administered 2020-04-13: 1 via ORAL
  Filled 2020-04-12: qty 1

## 2020-04-12 MED ORDER — FENTANYL CITRATE (PF) 100 MCG/2ML IJ SOLN
INTRAMUSCULAR | Status: DC | PRN
  Administered 2020-04-12: 100 ug via INTRAVENOUS

## 2020-04-12 MED ORDER — ZOLPIDEM TARTRATE 5 MG PO TABS: 5.0000 mg | ORAL_TABLET | Freq: Every evening | ORAL | Status: DC | PRN

## 2020-04-12 MED ORDER — FENTANYL CITRATE (PF) 100 MCG/2ML IJ SOLN: 25.0000 ug | INTRAMUSCULAR | Status: DC | PRN

## 2020-04-12 MED ORDER — CIPROFLOXACIN HCL 500 MG PO TABS
500.0000 mg | ORAL_TABLET | Freq: Once | ORAL | Status: AC
  Administered 2020-04-12: 500 mg via ORAL
  Filled 2020-04-12 (×2): qty 1

## 2020-04-12 MED ORDER — SODIUM CHLORIDE 0.45 % IV SOLN: INTRAVENOUS | Status: DC

## 2020-04-12 MED ORDER — ONDANSETRON HCL 4 MG/2ML IJ SOLN
INTRAMUSCULAR | Status: DC | PRN
  Administered 2020-04-12: 4 mg via INTRAVENOUS

## 2020-04-12 MED ORDER — SODIUM CHLORIDE 0.9 % IV SOLN
2.0000 g | Freq: Once | INTRAVENOUS | Status: AC
  Administered 2020-04-12: 2 g via INTRAVENOUS
  Filled 2020-04-12: qty 20

## 2020-04-12 MED ORDER — OXYCODONE HCL 5 MG PO TABS: 5.0000 mg | ORAL_TABLET | ORAL | Status: DC | PRN

## 2020-04-12 MED ORDER — MIDAZOLAM HCL 2 MG/2ML IJ SOLN
INTRAMUSCULAR | Status: AC
  Filled 2020-04-12: qty 2

## 2020-04-12 MED ORDER — BELLADONNA ALKALOIDS-OPIUM 16.2-60 MG RE SUPP
RECTAL | Status: DC | PRN
  Administered 2020-04-12: 1 via RECTAL

## 2020-04-12 MED ORDER — PROPOFOL 10 MG/ML IV BOLUS
INTRAVENOUS | Status: DC | PRN
  Administered 2020-04-12: 200 mg via INTRAVENOUS

## 2020-04-12 MED ORDER — SODIUM CHLORIDE 0.9 % IV SOLN
INTRAVENOUS | Status: DC | PRN
  Administered 2020-04-12: 3 mL

## 2020-04-12 MED ORDER — ONDANSETRON HCL 4 MG/2ML IJ SOLN: 4.0000 mg | Freq: Four times a day (QID) | INTRAMUSCULAR | Status: DC | PRN

## 2020-04-12 MED ORDER — HYDROMORPHONE HCL 1 MG/ML IJ SOLN: 0.5000 mg | INTRAMUSCULAR | Status: DC | PRN

## 2020-04-12 MED ORDER — AMISULPRIDE (ANTIEMETIC) 5 MG/2ML IV SOLN: 10.0000 mg | Freq: Once | INTRAVENOUS | Status: DC | PRN

## 2020-04-12 MED ORDER — TRAMADOL HCL 50 MG PO TABS: 50.0000 mg | ORAL_TABLET | Freq: Four times a day (QID) | ORAL | Status: DC | PRN

## 2020-04-12 MED ORDER — ORAL CARE MOUTH RINSE: 15.0000 mL | Freq: Once | OROMUCOSAL | Status: AC

## 2020-04-12 MED ORDER — LIDOCAINE HCL URETHRAL/MUCOSAL 2 % EX GEL
CUTANEOUS | Status: DC | PRN
  Administered 2020-04-12: 1 via URETHRAL

## 2020-04-12 MED ORDER — TAMSULOSIN HCL 0.4 MG PO CAPS
0.4000 mg | ORAL_CAPSULE | Freq: Every day | ORAL | Status: DC
  Administered 2020-04-13: 0.4 mg via ORAL
  Filled 2020-04-12: qty 1

## 2020-04-12 MED ORDER — ASPIRIN EC 81 MG PO TBEC
81.0000 mg | DELAYED_RELEASE_TABLET | Freq: Every day | ORAL | Status: DC
  Administered 2020-04-13: 81 mg via ORAL
  Filled 2020-04-12: qty 1

## 2020-04-12 MED ORDER — CHLORHEXIDINE GLUCONATE 0.12 % MT SOLN
15.0000 mL | Freq: Once | OROMUCOSAL | Status: AC
  Administered 2020-04-12: 15 mL via OROMUCOSAL

## 2020-04-12 MED ORDER — LACTATED RINGERS IV SOLN: INTRAVENOUS | Status: DC

## 2020-04-12 MED ORDER — DULOXETINE HCL 60 MG PO CPEP
60.0000 mg | ORAL_CAPSULE | Freq: Every day | ORAL | Status: DC
  Administered 2020-04-13: 60 mg via ORAL
  Filled 2020-04-12: qty 1

## 2020-04-12 MED ORDER — MIDAZOLAM HCL 2 MG/2ML IJ SOLN
INTRAMUSCULAR | Status: DC | PRN
  Administered 2020-04-12: 2 mg via INTRAVENOUS

## 2020-04-12 MED ORDER — DEXAMETHASONE SODIUM PHOSPHATE 10 MG/ML IJ SOLN
INTRAMUSCULAR | Status: DC | PRN
  Administered 2020-04-12: 5 mg via INTRAVENOUS

## 2020-04-12 MED ORDER — SODIUM CHLORIDE 0.9 % IR SOLN
Status: DC | PRN
  Administered 2020-04-12: 3000 mL
  Administered 2020-04-12: 1000 mL

## 2020-04-12 MED ORDER — PROPOFOL 10 MG/ML IV BOLUS
INTRAVENOUS | Status: AC
  Filled 2020-04-12: qty 20

## 2020-04-12 MED ORDER — ACETAMINOPHEN 500 MG PO TABS
1000.0000 mg | ORAL_TABLET | Freq: Once | ORAL | Status: AC
  Administered 2020-04-12: 1000 mg via ORAL
  Filled 2020-04-12: qty 2

## 2020-04-12 MED ORDER — BELLADONNA ALKALOIDS-OPIUM 16.2-30 MG RE SUPP
RECTAL | Status: AC
  Filled 2020-04-12: qty 1

## 2020-04-12 MED ORDER — LIDOCAINE HCL URETHRAL/MUCOSAL 2 % EX GEL
CUTANEOUS | Status: AC
  Filled 2020-04-12: qty 30

## 2020-04-12 MED ORDER — CIPROFLOXACIN IN D5W 400 MG/200ML IV SOLN
INTRAVENOUS | Status: DC | PRN
  Administered 2020-04-12: 400 mg via INTRAVENOUS

## 2020-04-12 MED ORDER — CIPROFLOXACIN IN D5W 400 MG/200ML IV SOLN
400.0000 mg | Freq: Two times a day (BID) | INTRAVENOUS | Status: DC
  Filled 2020-04-12: qty 200

## 2020-04-12 MED ORDER — FENTANYL CITRATE (PF) 100 MCG/2ML IJ SOLN
INTRAMUSCULAR | Status: AC
  Filled 2020-04-12: qty 2

## 2020-04-12 MED ORDER — LIDOCAINE HCL (PF) 2 % IJ SOLN
INTRAMUSCULAR | Status: AC
  Filled 2020-04-12: qty 5

## 2020-04-12 MED ORDER — DEXAMETHASONE SODIUM PHOSPHATE 10 MG/ML IJ SOLN
INTRAMUSCULAR | Status: AC
  Filled 2020-04-12: qty 1

## 2020-04-12 MED ORDER — SODIUM CHLORIDE 0.9 % IV BOLUS
1000.0000 mL | Freq: Once | INTRAVENOUS | Status: AC
  Administered 2020-04-12: 1000 mL via INTRAVENOUS

## 2020-04-12 MED ORDER — LIDOCAINE 2% (20 MG/ML) 5 ML SYRINGE
INTRAMUSCULAR | Status: DC | PRN
  Administered 2020-04-12: 100 mg via INTRAVENOUS

## 2020-04-12 MED ORDER — ROSUVASTATIN CALCIUM 10 MG PO TABS
10.0000 mg | ORAL_TABLET | Freq: Every day | ORAL | Status: DC
  Administered 2020-04-12: 10 mg via ORAL
  Filled 2020-04-12: qty 1

## 2020-04-12 MED ORDER — VITAMIN D (ERGOCALCIFEROL) 1.25 MG (50000 UNIT) PO CAPS: 50000.0000 [IU] | ORAL_CAPSULE | ORAL | Status: DC

## 2020-04-12 SURGICAL SUPPLY — 17 items
BAG URO CATCHER STRL LF (MISCELLANEOUS) ×2 IMPLANT
BASKET ZERO TIP NITINOL 2.4FR (BASKET) IMPLANT
CATH URET 5FR 28IN OPEN ENDED (CATHETERS) ×2 IMPLANT
CLOTH BEACON ORANGE TIMEOUT ST (SAFETY) ×2 IMPLANT
EXTRACTOR STONE 1.7FRX115CM (UROLOGICAL SUPPLIES) ×2 IMPLANT
GLOVE SURG ENC TEXT LTX SZ7.5 (GLOVE) ×2 IMPLANT
GOWN STRL REUS W/TWL XL LVL3 (GOWN DISPOSABLE) ×2 IMPLANT
GUIDEWIRE ANG ZIPWIRE 038X150 (WIRE) IMPLANT
GUIDEWIRE STR DUAL SENSOR (WIRE) ×2 IMPLANT
KIT TURNOVER KIT A (KITS) ×2 IMPLANT
LASER FIB FLEXIVA PULSE ID 365 (Laser) ×2 IMPLANT
MANIFOLD NEPTUNE II (INSTRUMENTS) ×2 IMPLANT
PACK CYSTO (CUSTOM PROCEDURE TRAY) ×2 IMPLANT
SHEATH URETERAL 12FRX28CM (UROLOGICAL SUPPLIES) IMPLANT
SHEATH URETERAL 12FRX35CM (MISCELLANEOUS) IMPLANT
TUBING CONNECTING 10 (TUBING) ×2 IMPLANT
TUBING UROLOGY SET (TUBING) ×2 IMPLANT

## 2020-04-12 NOTE — H&P (Signed)
Cc: Right flank pain, fever, right distal ureteral stones  History of present illness: 45 year old female who has had right-sided flank pain since mid December.  She has been treated twice for kidney infections.  Yesterday and her primary care office labs were obtained demonstrating a white blood cell count of 25,000.  She was urged to go to the emergency department and to be treated for pyelonephritis.  In the emergency room the provider obtained a CT scan which demonstrated a right distal ureteral stone with proximal hydroureteronephrosis.  The patient fortunately has been afebrile and hemodynamically stable despite having fevers on Wednesday.  Her fever was as high as 100.  In the emergency department she was given Rocephin and urology was consulted for further evaluation and management.  Review of systems: A 12 point comprehensive review of systems was obtained and is negative unless otherwise stated in the history of present illness.  Patient Active Problem List   Diagnosis Date Noted  . Nephrolithiasis 04/12/2020    No current facility-administered medications on file prior to encounter.   Current Outpatient Medications on File Prior to Encounter  Medication Sig Dispense Refill  . aspirin EC 81 MG tablet Take 81 mg by mouth daily. Swallow whole.    . DULoxetine (CYMBALTA) 60 MG capsule Take 60 mg by mouth daily.    Marland Kitchen glimepiride (AMARYL) 1 MG tablet Take 1 mg by mouth every morning.    . hydrOXYzine (ATARAX/VISTARIL) 25 MG tablet Take 25 mg by mouth 2 (two) times daily.    Marland Kitchen ibuprofen (ADVIL) 200 MG tablet Take 400 mg by mouth every 6 (six) hours as needed for fever, headache or mild pain.    . metFORMIN (GLUCOPHAGE) 500 MG tablet Take 1 tablet (500 mg total) by mouth 2 (two) times daily with a meal. 60 tablet 0  . Multiple Vitamin (MULTIVITAMIN WITH MINERALS) TABS tablet Take 1 tablet by mouth daily.    . nitrofurantoin, macrocrystal-monohydrate, (MACROBID) 100 MG capsule Take 100 mg by  mouth 2 (two) times daily. 7 [place] supply    . rosuvastatin (CRESTOR) 10 MG tablet Take 10 mg by mouth at bedtime.    . Vitamin D, Ergocalciferol, (DRISDOL) 1.25 MG (50000 UNIT) CAPS capsule Take 50,000 Units by mouth 2 (two) times a week.      Past Medical History:  Diagnosis Date  . Anxiety   . Diabetes mellitus without complication (HCC)   . UTI (urinary tract infection)     Past Surgical History:  Procedure Laterality Date  . BREAST LUMPECTOMY WITH RADIOACTIVE SEED LOCALIZATION Left 03/21/2015   Procedure: LEFT BREAST LUMPECTOMY WITH RADIOACTIVE SEED LOCALIZATION;  Surgeon: Harriette Bouillon, MD;  Location: [facility];  Service: General;  Laterality: Left;  . CHOLECYSTECTOMY    . SINUS EXPLORATION  2002    Social History   Tobacco Use  . Smoking status: Former Smoker    Packs/Lack: 0.25    Types: Cigarettes  . Smokeless tobacco: Never Used  Substance Use Topics  . Alcohol use: Not Currently    Comment: occasional  . Drug use: No    No family history on file.  PE: Vitals:   04/12/20 0904 04/12/20 1000 04/12/20 1403 04/12/20 1425  BP: 112/68 115/63 111/64 111/64  Pulse: 85 88 85 89  Resp: 16 17 16 16   Temp: 98.1 F (36.7 C)     TempSrc: Oral     SpO2: 99% 98% 98% 98%  Weight:      Height:  Patient appears to be in no acute distress  patient is alert and oriented x3 Atraumatic normocephalic head No cervical or supraclavicular lymphadenopathy appreciated No increased work of breathing, no audible wheezes/rhonchi Regular sinus rhythm/rate Abdomen is soft, nontender, nondistended, right CVA tenderness Lower extremities are symmetric without appreciable edema Grossly neurologically intact No identifiable skin lesions  Recent Labs    04/12/20 0741  WBC 15.8*  HGB 11.8*  HCT 35.0*   Recent Labs    04/12/20 0741  NA 134*  K 3.7  CL 102  CO2 21*  GLUCOSE 236*  BUN 14  CREATININE 0.77  CALCIUM 9.3   No results for input(s): LABPT, INR  in the last 72 hours. No results for input(s): LABURIN in the last 72 hours. Results for orders placed or performed during the hospital encounter of 04/12/20  SARS Coronavirus 2 by RT PCR (hospital order, performed in [facility] hospital lab) Nasopharyngeal Nasopharyngeal Swab     Status: None   Collection Time: 04/12/20 11:01 AM   Specimen: Nasopharyngeal Swab  Result Value Ref Range Status   SARS Coronavirus 2 NEGATIVE NEGATIVE Final    Comment: (NOTE) SARS-CoV-2 target nucleic acids are NOT DETECTED.  The SARS-CoV-2 RNA is generally detectable in upper and lower respiratory specimens during the acute phase of infection. The lowest concentration of SARS-CoV-2 viral copies this assay can detect is 250 copies / mL. A negative result does not preclude SARS-CoV-2 infection and should not be used as the sole basis for treatment or other patient management decisions.  A negative result may occur with improper specimen collection / handling, submission of specimen other than nasopharyngeal swab, presence of viral mutation(s) within the areas targeted by this assay, and inadequate number of viral copies (<250 copies / mL). A negative result must be combined with clinical observations, patient history, and epidemiological information.  Fact Sheet for Patients:   BoilerBrush.com.cy  Fact Sheet for Healthcare Providers: https://pope.com/  This test is not yet approved or  cleared by the [place] FDA and has been authorized for detection and/or diagnosis of SARS-CoV-2 by FDA under an Emergency Use Authorization (EUA).  This EUA will remain in effect (meaning this test can be used) for the duration of the COVID-19 declaration under Section 564(b)(1) of the Act, 21 U.S.C. section 360bbb-3(b)(1), unless the authorization is terminated or revoked sooner.  Performed at [facility], [address] [place], [place]  [address]     Imaging: I have independently reviewed the patient's CT scan and my findings are as noted above.  Imp: The patient has a right distal ureteral stone that she has had now for several months.  She had fever 2 days ago and currently has a leukocytosis.  She has been hemodynamically stable.  Her pain has not been well controlled.  Recommendations: Plan is to proceed to the OR urgently for ureteroscopy and stone removal and stent placement.  I went through the procedure with the and explained the risk and the associated benefits.  The patient has opted to proceed.  Following the surgery we will admit the patient for observation to ensure that she does not develop bacteremia.

## 2020-04-12 NOTE — Op Note (Signed)
Preoperative diagnosis: right ureteral calculus  Postoperative diagnosis: right ureteral calculus  Procedure:  1. Cystoscopy 2. right ureteroscopy and stone removal 3. Ureteroscopic laser lithotripsy 4. right 77F x 26cm ureteral stent placement  5. right retrograde pyelography with interpretation  Surgeon: Crist Fat, MD  Anesthesia: General  Complications: None  Intraoperative findings: right retrograde pyelography demonstrated a filling defect within the right ureter consistent with the patient's known calculus without other abnormalities.  EBL: Minimal  Specimens: 1. right ureteral calculus  Disposition of specimens: [facility] for stone analysis  Indication: Miranda Lawson is a 45 y.o.   patient with a right ureteral stone and associated right symptoms. After reviewing the management options for treatment, the patient elected to proceed with the above surgical procedure(s). We have discussed the potential benefits and risks of the procedure, side effects of the proposed treatment, the likelihood of the patient achieving the goals of the procedure, and any potential problems that might occur during the procedure or recuperation. Informed consent has been obtained.   Description of procedure:  The patient was taken to the operating room and general anesthesia was induced.  The patient was placed in the dorsal lithotomy position, prepped and draped in the usual sterile fashion, and preoperative antibiotics were administered. A preoperative time-out was performed.   Cystourethroscopy was performed.  The patient's urethra was examined and was normal. The bladder was then systematically examined in its entirety. There was no evidence for any bladder tumors, stones, or other mucosal pathology.    Attention then turned to the right ureteral orifice and a ureteral catheter was used to intubate the ureteral orifice.  Omnipaque contrast was injected through the  ureteral catheter and a retrograde pyelogram was performed with findings as dictated above.  A 0.38 sensor guidewire was then advanced up the right ureter into the renal pelvis under fluoroscopic guidance. The 6 Fr semirigid ureteroscope was then advanced into the ureter next to the guidewire and the calculus was identified.   The stone was then fragmented with the 365 micron holmium laser fiber on a setting of 1.0 and frequency of 10 Hz.   All stones were then removed from the ureter with an N-gage nitinol basket.  Reinspection of the ureter revealed no remaining visible stones or fragments.   The wire was then backloaded through the cystoscope and a ureteral stent was advance over the wire using Seldinger technique.  The stent was positioned appropriately under fluoroscopic and cystoscopic guidance.  The wire was then removed with an adequate stent curl noted in the renal pelvis as well as in the bladder.  The bladder was then emptied and the procedure ended.  The patient appeared to tolerate the procedure well and without complications.  The patient was able to be awakened and transferred to the recovery unit in satisfactory condition.   Disposition: The tether of the stent was left on and  tucked inside the patient's vagina.  Instructions for removing the stent have been provided to the patient. The patient has been scheduled for followup in 6 weeks with a renal ultrasound.

## 2020-04-12 NOTE — Anesthesia Postprocedure Evaluation (Signed)
Anesthesia Post Note  Patient: Miranda Lawson  Procedure(Lawson) Performed: CYSTOSCOPY WITH RETROGRADE PYELOGRAM, LASER LITHOTRIPSY, URETEROSCOPY AND STENT PLACEMENT (Right )     Patient location during evaluation: PACU Anesthesia Type: General Level of consciousness: awake and alert Pain management: pain level controlled Vital Signs Assessment: post-procedure vital signs reviewed and stable Respiratory status: spontaneous breathing, nonlabored ventilation, respiratory function stable and patient connected to nasal cannula oxygen Cardiovascular status: blood pressure returned to baseline and stable Postop Assessment: no apparent nausea or vomiting Anesthetic complications: no   No complications documented.  Last Vitals:  Vitals:   04/12/20 1856 04/12/20 1906  BP:    Pulse: 69 71  Resp: 14 13  Temp:    SpO2: 96% 97%    Last Pain:  Vitals:   04/12/20 1900  TempSrc:   PainSc: 0-No pain                 Miranda Lawson

## 2020-04-12 NOTE — Anesthesia Preprocedure Evaluation (Signed)
Anesthesia Evaluation  Patient identified by MRN, date of birth, ID band Patient awake    Reviewed: Allergy & Precautions, NPO status , Patient's Chart, lab work & pertinent test results  Airway Mallampati: II  TM Distance: >3 FB     Dental  (+) Dental Advisory Given   Pulmonary former smoker,    breath sounds clear to auscultation       Cardiovascular negative cardio ROS   Rhythm:Regular Rate:Normal     Neuro/Psych negative neurological ROS     GI/Hepatic negative GI ROS, Neg liver ROS,   Endo/Other  diabetes, Type 2, Oral Hypoglycemic Agents  Renal/GU Renal disease     Musculoskeletal   Abdominal   Peds  Hematology  (+) anemia ,   Anesthesia Other Findings   Reproductive/Obstetrics                             Anesthesia Physical Anesthesia Plan  ASA: II  Anesthesia Plan: General   Post-op Pain Management:    Induction: Intravenous  PONV Risk Score and Plan: 3 and Ondansetron, Dexamethasone and Treatment may vary due to age or medical condition  Airway Management Planned: LMA  Additional Equipment:   Intra-op Plan:   Post-operative Plan: Extubation in OR  Informed Consent: I have reviewed the patients History and Physical, chart, labs and discussed the procedure including the risks, benefits and alternatives for the proposed anesthesia with the patient or authorized representative who has indicated his/her understanding and acceptance.     Dental advisory given  Plan Discussed with: CRNA  Anesthesia Plan Comments:         Anesthesia Quick Evaluation

## 2020-04-12 NOTE — Transfer of Care (Signed)
Immediate Anesthesia Transfer of Care Note  Patient: Miranda Lawson  Procedure(s) Performed: CYSTOSCOPY WITH RETROGRADE PYELOGRAM, LASER LITHOTRIPSY, URETEROSCOPY AND STENT PLACEMENT (Right )  Patient Location: PACU  Anesthesia Type:General  Level of Consciousness: awake and alert   Airway & Oxygen Therapy: Patient Spontanous Breathing and Patient connected to face mask oxygen  Post-op Assessment: Report given to RN and Post -op Vital signs reviewed and stable  Post vital signs: Reviewed and stable  Last Vitals:  Vitals Value Taken Time  BP 122/76 04/12/20 1835  Temp    Pulse 74 04/12/20 1837  Resp 18 04/12/20 1837  SpO2 100 % 04/12/20 1837  Vitals shown include unvalidated device data.  Last Pain:  Vitals:   04/12/20 1341  TempSrc:   PainSc: 0-No pain         Complications: No complications documented.

## 2020-04-12 NOTE — Anesthesia Procedure Notes (Signed)
Date/Time: 04/12/2020 6:24 PM Performed by: Minerva Ends, CRNA Oxygen Delivery Method: Simple face mask Placement Confirmation: positive ETCO2 and breath sounds checked- equal and bilateral Dental Injury: Teeth and Oropharynx as per pre-operative assessment

## 2020-04-12 NOTE — Anesthesia Procedure Notes (Signed)
Procedure Name: LMA Insertion Date/Time: 04/12/2020 5:59 PM Performed by: Minerva Ends, CRNA Pre-anesthesia Checklist: Patient identified, Emergency Drugs available, Suction available and Patient being monitored Patient Re-evaluated:Patient Re-evaluated prior to induction Oxygen Delivery Method: Circle System Utilized Preoxygenation: Pre-oxygenation with 100% oxygen Induction Type: IV induction Ventilation: Mask ventilation without difficulty LMA: LMA inserted and LMA with gastric port inserted LMA Size: 4.0 Number of attempts: 1 Placement Confirmation: positive ETCO2 Tube secured with: Tape Dental Injury: Teeth and Oropharynx as per pre-operative assessment  Comments: Smooth IV induction Rose--LMA insertion AM CRNA atraumatic- slight chipping on front teeth unchanged with LMA insertion- bilat BS Rose

## 2020-04-12 NOTE — ED Provider Notes (Signed)
[facility]-EMERGENCY DEPT Provider Note   CSN: [id number] Arrival date & time: 04/12/20  [id number]     History Chief Complaint  Patient presents with  . abnormal Labs    Miranda Lawson is a 45 y.o. female.  45 yo female presents with complaint of right flank pain onset 2 days ago. Patient went to her PCP, reported temperatures with T-max of 100 with right flank pain which radiates to right lower quadrant at times with nausea, vomiting, urinary frequency.  Denies gross hematuria and dysuria.  No history of kidney stones.  Patient states that she has had 6 kidney infections in the past year, always on the right side.  Most recent infection was last month, prior to that infection was in December.  For the first 2 infections, patient was treated with Rocephin IM and Augmentin.  PCP started patient on Macrobid yesterday after dose of Rocephin in clinic.  Labs and CT were ordered, patient was contacted this morning by the lab nurse and told that her white count was high and she needed to go to the ER.  Patient said that she is feeling somewhat better today, denies nausea, vomiting, fevers. Reports resistance to antibiotics on her prior cultures.         Past Medical History:  Diagnosis Date  . Anxiety   . Diabetes mellitus without complication (HCC)   . UTI (urinary tract infection)     Patient Active Problem List   Diagnosis Date Noted  . Nephrolithiasis 04/12/2020    Past Surgical History:  Procedure Laterality Date  . BREAST LUMPECTOMY WITH RADIOACTIVE SEED LOCALIZATION Left 03/21/2015   Procedure: LEFT BREAST LUMPECTOMY WITH RADIOACTIVE SEED LOCALIZATION;  Surgeon: Harriette Bouillonhomas Cornett, MD;  Location: [facility];  Service: General;  Laterality: Left;  . CHOLECYSTECTOMY    . SINUS EXPLORATION  2002     OB History   No obstetric history on file.     No family history on file.  Social History   Tobacco Use  . Smoking status: Former Smoker     Packs/Kocher: 0.25    Types: Cigarettes  . Smokeless tobacco: Never Used  Substance Use Topics  . Alcohol use: Not Currently    Comment: occasional  . Drug use: No    Home Medications Prior to Admission medications   Medication Sig Start Date End Date Taking? Authorizing Provider  aspirin EC 81 MG tablet Take 81 mg by mouth daily. Swallow whole.   Yes [provider]  DULoxetine (CYMBALTA) 60 MG capsule Take 60 mg by mouth daily. 03/21/20  Yes [provider]  glimepiride (AMARYL) 1 MG tablet Take 1 mg by mouth every morning. 03/26/20  Yes [provider]  hydrOXYzine (ATARAX/VISTARIL) 25 MG tablet Take 25 mg by mouth 2 (two) times daily. 03/26/20  Yes [provider]  ibuprofen (ADVIL) 200 MG tablet Take 400 mg by mouth every 6 (six) hours as needed for fever, headache or mild pain.   Yes [provider]  metFORMIN (GLUCOPHAGE) 500 MG tablet Take 1 tablet (500 mg total) by mouth 2 (two) times daily with a meal. 06/07/19  Yes Gerhard MunchLockwood, Robert, MD  Multiple Vitamin (MULTIVITAMIN WITH MINERALS) TABS tablet Take 1 tablet by mouth daily.   Yes [provider]  nitrofurantoin, macrocrystal-monohydrate, (MACROBID) 100 MG capsule Take 100 mg by mouth 2 (two) times daily. 7 Meiner supply 04/11/20  Yes [provider]  rosuvastatin (CRESTOR) 10 MG tablet Take 10 mg by mouth  at bedtime. 03/26/20  Yes [provider]  Vitamin D, Ergocalciferol, (DRISDOL) 1.25 MG (50000 UNIT) CAPS capsule Take 50,000 Units by mouth 2 (two) times a week. 03/26/20  Yes [provider]    Allergies    Patient has no known allergies.  Review of Systems   Review of Systems  Constitutional: Positive for fever.  Respiratory: Negative for shortness of breath.   Cardiovascular: Negative for chest pain.  Gastrointestinal: Positive for nausea and vomiting. Negative for constipation and diarrhea.  Genitourinary: Positive for flank pain and frequency. Negative  for dysuria and hematuria.  Musculoskeletal: Positive for back pain.  Skin: Negative for rash and wound.  Allergic/Immunologic: Positive for immunocompromised state.  Neurological: Negative for headaches.  Hematological: Negative for adenopathy.  All other systems reviewed and are negative.   Physical Exam Updated Vital Signs BP 115/63   Pulse 88   Temp 98.1 F (36.7 C) (Oral)   Resp 17   Ht 5\' 9"  (1.753 m)   Wt 95.3 kg   SpO2 98%   BMI 31.01 kg/m   Physical Exam Vitals and nursing note reviewed.  Constitutional:      General: She is not in acute distress.    Appearance: She is well-developed and well-nourished. She is not diaphoretic.  HENT:     Head: Normocephalic and atraumatic.  Cardiovascular:     Rate and Rhythm: Normal rate and regular rhythm.     Pulses: Normal pulses.     Heart sounds: Normal heart sounds.  Pulmonary:     Effort: Pulmonary effort is normal.     Breath sounds: Normal breath sounds.  Abdominal:     Palpations: Abdomen is soft.     Tenderness: There is no abdominal tenderness. There is no right CVA tenderness or left CVA tenderness.  Skin:    General: Skin is warm and dry.     Findings: No erythema or rash.  Neurological:     Mental Status: She is alert and oriented to person, place, and time.  Psychiatric:        Mood and Affect: Mood and affect normal.        Behavior: Behavior normal.     ED Results / Procedures / Treatments   Labs (all labs ordered are listed, but only abnormal results are displayed) Labs Reviewed  COMPREHENSIVE METABOLIC PANEL - Abnormal; Notable for the following components:      Result Value   Sodium 134 (*)    CO2 21 (*)    Glucose, Bld 236 (*)    All other components within normal limits  CBC WITH DIFFERENTIAL/PLATELET - Abnormal; Notable for the following components:   WBC 15.8 (*)    Hemoglobin 11.8 (*)    HCT 35.0 (*)    Neutro Abs 12.7 (*)    All other components within normal limits  URINALYSIS,  ROUTINE W REFLEX MICROSCOPIC - Abnormal; Notable for the following components:   APPearance HAZY (*)    Hgb urine dipstick MODERATE (*)    Leukocytes,Ua LARGE (*)    WBC, UA >50 (*)    Bacteria, UA RARE (*)    All other components within normal limits  URINE CULTURE  SARS CORONAVIRUS 2 BY RT PCR (HOSPITAL ORDER, PERFORMED IN [facility])    EKG None  Radiology CT Renal Stone Study  Result Date: 04/12/2020 CLINICAL DATA:  RIGHT flank discomfort EXAM: CT ABDOMEN AND PELVIS WITHOUT CONTRAST TECHNIQUE: Multidetector CT imaging of the abdomen and pelvis  was performed following the standard protocol without IV contrast. COMPARISON:  None. FINDINGS: Lower chest: No acute abnormality. Hepatobiliary: Status post cholecystectomy. Unremarkable noncontrast appearance of the liver. Pancreas: No peripancreatic fat stranding. Spleen: Unremarkable. Adrenals/Urinary Tract: There is moderate RIGHT hydroureteronephrosis with fat stranding adjacent to the proximal collecting system. There is a collection of obstructing ureterolithiasis at the RIGHT UVJ. Largest ureterolithiasis measures approximately 5 mm. No LEFT-sided hydronephrosis. Subcentimeter hypodense lesion of the LEFT kidney is too small to accurately characterize. There is mild fat stranding adjacent to the RIGHT aspect of the bladder near site of UVJ insertion. There is a LEFT adrenal adenoma which measures 13 mm. RIGHT adrenal gland is unremarkable. Stomach/Bowel: Stomach is within normal limits. Appendix appears normal. No evidence of bowel wall thickening, distention, or inflammatory changes. Vascular/Lymphatic: Mild atherosclerotic calcifications. No suspicious lymphadenopathy. Mildly prominent retrocaval lymph node is likely reactive in etiology with representative retrocaval lymph node measuring 9 mm in the short axis (series 2, image 26). Reproductive: Fibroid uterus. Other: No free air or free fluid. Musculoskeletal: Mild degenerative  changes of L2-3. IMPRESSION: 1. Moderate RIGHT hydroureteronephrosis secondary to a collection of obstructing ureterolithiasis at the RIGHT UVJ. Largest ureterolithiasis measures 5 mm. 2. Fat stranding adjacent to the RIGHT renal collecting system is likely reactive in etiology. Recommend correlation with urinalysis to exclude superimposed infection. Aortic Atherosclerosis (ICD10-I70.0). Electronically Signed   By: Meda Klinefelter MD   On: 04/12/2020 08:30    Procedures Procedures   Medications Ordered in ED Medications  sodium chloride 0.9 % bolus 1,000 mL (0 mLs Intravenous Stopped 04/12/20 0923)  cefTRIAXone (ROCEPHIN) 2 g in sodium chloride 0.9 % 100 mL IVPB (2 g Intravenous New Bag/Given 04/12/20 1111)    ED Course  I have reviewed the triage vital signs and the nursing notes.  Pertinent labs & imaging results that were available during my care of the patient were reviewed by me and considered in my medical decision making (see chart for details).  Clinical Course as of 04/12/20 1147  Fri Apr 12, 2020  [id number] 45 year old female reports history of recurrent pyelonephritis, onset of symptoms with this episode on Wednesday (2 days ago).  States that she had a fever with nausea, vomiting, right flank pain. Patient was seen at her PCP office yesterday, given IM Rocephin and started on p.o. Macrobid.  Patient had lab work sent out and was called today for white count 25,000, advised to come to the emergency room. On arrival in the emergency room, patient states she is feeling much better today, has been afebrile, vomiting and nausea have resolved, pain has significantly improved.  Reports urinary frequency without dysuria or gross hematuria.  CBC with leukocytosis with white count of 15.8 with neutrophils elevated at 12.7.  CMP with hyperglycemia with glucose of 236, known diagnosis of diabetes.  Urinalysis with moderate hemoglobin, large leukocytes, greater than 50 white blood cells, however is  a contaminated sample with 6-10 epithelials, will send for culture. [LM]  1145 CT scan shows moderate right hydroureteronephrosis secondary to a collection of obstructing urolithiasis at the right UVJ.  Fat stranding adjacent to the right renal collecting system.  Case discussed with Dr. Marlou Porch, on-call with urology, plan is to admit for likely stent, request patient n.p.o.  Patient given Rocephin IV.  Discussed results and plan of care with patient who is agreeable with plan of care. [LM]    Clinical Course User Index [LM] Alden Hipp   MDM Rules/Calculators/A&P  Final Clinical Impression(s) / ED Diagnoses Final diagnoses:  Ureterolithiasis    Rx / DC Orders ED Discharge Orders    None       Alden Hipp 04/12/20 1147    Lorre Nick, MD 04/16/20 1019

## 2020-04-12 NOTE — ED Triage Notes (Signed)
Pt saw her PCP yesterday and had labs obtained due to frequent kidney infections. [facility] called her late yesterday and told her to come to the ED due to abnormal WBC count.

## 2020-04-13 ENCOUNTER — Encounter (HOSPITAL_COMMUNITY): Payer: Self-pay | Admitting: Urology

## 2020-04-13 LAB — URINE CULTURE: Culture: NO GROWTH

## 2020-04-13 MED ORDER — PHENAZOPYRIDINE HCL 200 MG PO TABS: 200.0000 mg | ORAL_TABLET | Freq: Three times a day (TID) | ORAL | 0 refills | Status: DC | PRN

## 2020-04-13 MED ORDER — SULFAMETHOXAZOLE-TRIMETHOPRIM 800-160 MG PO TABS: 1.0000 | ORAL_TABLET | Freq: Two times a day (BID) | ORAL | 0 refills | Status: DC

## 2020-04-13 MED ORDER — TRAMADOL HCL 50 MG PO TABS: 50.0000 mg | ORAL_TABLET | Freq: Four times a day (QID) | ORAL | 0 refills | Status: DC | PRN

## 2020-04-13 NOTE — Discharge Summary (Signed)
Date of admission: 04/12/2020  Date of discharge: 04/13/2020  Admission diagnosis: Right obstructing ureteral stone and UTI  Discharge diagnosis: Same  Secondary diagnoses:  Patient Active Problem List   Diagnosis Date Noted  . Nephrolithiasis 04/12/2020  . Urolithiasis 04/12/2020    Procedures performed: Procedure(s): CYSTOSCOPY WITH RETROGRADE PYELOGRAM, LASER LITHOTRIPSY, URETEROSCOPY AND STENT PLACEMENT  History and Physical: For full details, please see admission history and physical. Briefly, Miranda Lawson is a 45 y.o. year old patient with long history of right-sided flank pain with low-grade fevers and leukocytosis.    Hospital Course: Patient tolerated the procedure well.  She was then transferred to the floor after an uneventful PACU stay.  Her hospital course was uncomplicated.  On POD#1 she had met discharge criteria: was eating a regular diet, was up and ambulating independently,  pain was well controlled, was voiding without a catheter, and was ready to for discharge.   Physical exam on date of discharge: Vitals:   04/12/20 1946 04/13/20 0043 04/13/20 0457 04/13/20 0751  BP: 118/69 119/77 113/69 105/71  Pulse: 71 66 (!) 59 72  Resp: _0 Temp: 98.5 F (36.9 C) 97.6 F (36.4 C) 97.6 F (36.4 C) 98.8 F (37.1 C)  TempSrc:   Oral Oral  SpO2: 96% 98% 96% 99%  Weight:      Height:        Intake/Output Summary (Last 24 hours) at 04/13/2020 0921 Last data filed at 04/12/2020 1834 Gross per 24 hour  Intake 1600 ml  Output 0 ml  Net 1600 ml    No acute distress Nonlabored breathing Abdomen soft Extremities symmetric   Laboratory values:  Recent Labs    04/12/20 0741  WBC 15.8*  HGB 11.8*  HCT 35.0*   Recent Labs    04/12/20 0741  NA 134*  K 3.7  CL 102  CO2 21*  GLUCOSE 236*  BUN 14  CREATININE 0.77  CALCIUM 9.3   No results for input(s): LABPT, INR in the last 72 hours. No results for input(s): LABURIN in the last 72 hours. Results  for orders placed or performed during the hospital encounter of 04/12/20  SARS Coronavirus 2 by RT PCR (hospital order, performed in [facility] hospital lab) Nasopharyngeal Nasopharyngeal Swab     Status: None   Collection Time: 04/12/20 11:01 AM   Specimen: Nasopharyngeal Swab  Result Value Ref Range Status   SARS Coronavirus 2 NEGATIVE NEGATIVE Final    Comment: (NOTE) SARS-CoV-2 target nucleic acids are NOT DETECTED.  The SARS-CoV-2 RNA is generally detectable in upper and lower respiratory specimens during the acute phase of infection. The lowest concentration of SARS-CoV-2 viral copies this assay can detect is 250 copies / mL. A negative result does not preclude SARS-CoV-2 infection and should not be used as the sole basis for treatment or other patient management decisions.  A negative result may occur with improper specimen collection / handling, submission of specimen other than nasopharyngeal swab, presence of viral mutation(s) within the areas targeted by this assay, and inadequate number of viral copies (<250 copies / mL). A negative result must be combined with clinical observations, patient history, and epidemiological information.  Fact Sheet for Patients:   StrictlyIdeas.no  Fact Sheet for Healthcare Providers: BankingDealers.co.za  This test is not yet approved or  cleared by the [place] FDA and has been authorized for detection and/or diagnosis of SARS-CoV-2 by FDA under an Emergency Use Authorization (EUA).  This EUA will remain  in effect (meaning this test can be used) for the duration of the COVID-19 declaration under Section 564(b)(1) of the Act, 21 U.S.C. section 360bbb-3(b)(1), unless the authorization is terminated or revoked sooner.  Performed at [facility], [place] [address], [place], [place] [address]     Disposition: Home  Discharge instruction: The patient was instructed to be  ambulatory but told to refrain from heavy lifting, strenuous activity, or driving.   Discharge medications:  Allergies as of 04/13/2020   No Known Allergies     Medication List    TAKE these medications   aspirin EC 81 MG tablet Take 81 mg by mouth daily. Swallow whole.   DULoxetine 60 MG capsule Commonly known as: CYMBALTA Take 60 mg by mouth daily.   glimepiride 1 MG tablet Commonly known as: AMARYL Take 1 mg by mouth every morning.   hydrOXYzine 25 MG tablet Commonly known as: ATARAX/VISTARIL Take 25 mg by mouth 2 (two) times daily.   ibuprofen 200 MG tablet Commonly known as: ADVIL Take 400 mg by mouth every 6 (six) hours as needed for fever, headache or mild pain.   metFORMIN 500 MG tablet Commonly known as: GLUCOPHAGE Take 1 tablet (500 mg total) by mouth 2 (two) times daily with a meal.   multivitamin with minerals Tabs tablet Take 1 tablet by mouth daily.   phenazopyridine 200 MG tablet Commonly known as: Pyridium Take 1 tablet (200 mg total) by mouth 3 (three) times daily as needed for pain.   rosuvastatin 10 MG tablet Commonly known as: CRESTOR Take 10 mg by mouth at bedtime.   sulfamethoxazole-trimethoprim 800-160 MG tablet Commonly known as: BACTRIM DS Take 1 tablet by mouth 2 (two) times daily.   traMADol 50 MG tablet Commonly known as: ULTRAM Take 1-2 tablets (50-100 mg total) by mouth every 6 (six) hours as needed for moderate pain.   Vitamin D (Ergocalciferol) 1.25 MG (50000 UNIT) Caps capsule Commonly known as: DRISDOL Take 50,000 Units by mouth 2 (two) times a week.       Followup:

## 2020-04-13 NOTE — Discharge Instructions (Signed)
DISCHARGE INSTRUCTIONS FOR KIDNEY STONE/URETERAL STENT   MEDICATIONS:  1.  Resume all your other meds from home - except do not take any extra narcotic pain meds that you may have at home.  2. Pyridium is to help with the burning/stinging when you urinate. 3. Tramadol is for moderate/severe pain, otherwise taking upto 1000 mg every 6 hours of plainTylenol will help treat your pain.   4. Take Cipro one hour prior to removal of your stent.   ACTIVITY:  1. No strenuous activity x 1week  2. No driving while on narcotic pain medications  3. Drink plenty of water  4. Continue to walk at home - you can still get blood clots when you are at home, so keep active, but don't over do it.  5. May return to work/school tomorrow or when you feel ready   BATHING:  1. You can shower and we recommend daily showers  2. You have a string coming from your urethra: The stent string is attached to your ureteral stent. Do not pull on this.   SIGNS/SYMPTOMS TO CALL:  Please call us if you have a fever greater than 101.5, uncontrolled nausea/vomiting, uncontrolled pain, dizziness, unable to urinate, bloody urine, chest pain, shortness of breath, leg swelling, leg pain, redness around wound, drainage from wound, or any other concerns or questions.   You can reach [place] at [phone].   FOLLOW-UP:  1. You have an appointment in 6 weeks with a ultrasound of your kidneys prior.  2. You have a string attached to your stent,  you may remove it on Wednesday, Feb 23rd . To do this, pull the strings until the stents are completely removed. You may feel an odd sensation in your back.

## 2020-04-13 NOTE — Progress Notes (Signed)
Went over discharge paperwork with patient.  All questions answered.  VSS.  Pt wheeled out by NT.

## 2020-08-13 ENCOUNTER — Other Ambulatory Visit: Payer: Self-pay | Admitting: Physician Assistant

## 2020-08-13 DIAGNOSIS — Z1231 Encounter for screening mammogram for malignant neoplasm of breast: Secondary | ICD-10-CM

## 2020-10-04 ENCOUNTER — Ambulatory Visit
Admission: RE | Admit: 2020-10-04 | Discharge: 2020-10-04 | Disposition: A | Discharge status: Home / Self Care | Payer: 59 | Source: Ambulatory Visit | Attending: Physician Assistant | Admitting: Physician Assistant

## 2020-10-04 ENCOUNTER — Other Ambulatory Visit: Payer: Self-pay

## 2020-10-04 DIAGNOSIS — Z1231 Encounter for screening mammogram for malignant neoplasm of breast: Secondary | ICD-10-CM

## 2021-09-03 ENCOUNTER — Other Ambulatory Visit: Payer: Self-pay | Admitting: Physician Assistant

## 2021-09-03 DIAGNOSIS — Z1231 Encounter for screening mammogram for malignant neoplasm of breast: Secondary | ICD-10-CM

## 2021-10-06 ENCOUNTER — Ambulatory Visit: Payer: Self-pay

## 2021-10-31 ENCOUNTER — Ambulatory Visit: Payer: Self-pay

## 2023-07-28 ENCOUNTER — Ambulatory Visit: Admitting: Podiatry

## 2023-07-28 ENCOUNTER — Encounter: Payer: Self-pay | Admitting: Podiatry

## 2023-07-28 DIAGNOSIS — M25572 Pain in left ankle and joints of left foot: Secondary | ICD-10-CM

## 2023-07-28 MED ORDER — TRIAMCINOLONE ACETONIDE 10 MG/ML IJ SUSP
10.0000 mg | Freq: Once | INTRAMUSCULAR | Status: AC
  Administered 2023-07-28: 10 mg via INTRA_ARTICULAR

## 2023-07-28 NOTE — Progress Notes (Signed)
 Incident with complaint of pain in the midfoot around the 1st and 2nd tarsal metatarsal phalangeal joints left and also at the ankle left.  This started several weeks ago.  Has not noticed any redness.  States she is happy with the way the nails are growing out clear.  Recall any injury to the foot.   Physical exam:  General appearance: Pleasant, and in no acute distress. AOx3.  Vascular: Pedal pulses: DP 2/4, PT 2/4.  Mild edema lower legs bilaterally. Capillary fill time immediate.  Neurological: Light touch intact feet bilaterally.  Normal Achilles reflex bilaterally.  No clonus or spasticity noted.   Dermatologic:   Skin normal temperature bilaterally.  Skin normal color, tone, and texture bilaterally.  Nails are 70% clear of fungal changes  Musculoskeletal: With palpation and range of motion at the first tarsal metatarsal phalangeal joint left.  Some tenderness to palpation at the second TMT left.  Tenderness at the anterior ankle left.  Some tenderness in the sinus tarsi.  No crepitation with range of motion noted.  Radiographs: None  Diagnosis: 1.  Arthralgia first tarsal metatarsal phalangeal joint left.  Plan: -Discussed the pain in the joints in the foot.  Will do an injection of the first TMT left today.  Discussed with proper shoes to wear.  If the ankle still bothering her we may do to inject this on her next visit - -injected 3cc 2:1 mixture 0.5 cc Marcaine :Kenolog 10mg /68ml at first tarsometatarsal phalangeal joint left foot. -RICE     Return 1 week follow-up injection left

## 2023-08-03 ENCOUNTER — Ambulatory Visit (INDEPENDENT_AMBULATORY_CARE_PROVIDER_SITE_OTHER): Admitting: Podiatry

## 2023-08-03 ENCOUNTER — Encounter: Payer: Self-pay | Admitting: Podiatry

## 2023-08-03 DIAGNOSIS — M25572 Pain in left ankle and joints of left foot: Secondary | ICD-10-CM

## 2023-08-03 MED ORDER — TRIAMCINOLONE ACETONIDE 10 MG/ML IJ SUSP
10.0000 mg | Freq: Once | INTRAMUSCULAR | Status: AC
  Administered 2023-08-03: 10 mg

## 2023-08-03 NOTE — Progress Notes (Signed)
 Patient presents with a complaint of pain at the ankle left.  Dorsal part of the foot is feeling much better.  Pain with range of motion of the ankle walking.  Has not noticed any redness or swelling. does not recall any injury   Physical exam:  General appearance: Pleasant, and in no acute distress. AOx3.  Vascular: Pedal pulses: DP 2/4 bilaterally, PT 2/4 bilaterally.  Moderate edema lower legs bilaterally. Capillary fill time immediate.  Neurological: Light touch intact feet bilaterally.  Normal Achilles reflex bilaterally.  No clonus or spasticity noted.   Dermatologic:   Skin normal temperature bilaterally.  Skin normal color, tone, and texture bilaterally.   Musculoskeletal: Tenderness anterior medial ankle joint with palpation and range of motion of the left.  No crepitation noted.  No tenderness at the dorsal aspect of the foot at the tarsal metatarsal joints.  Radiographs: None today  Diagnosis: 1.  Arthralgia ankle joint left  Plan: -Discussed the ankle joint pain recommended injection.  She is improved on the dorsal aspect of the foot at the second tarsometatarsal joint.  Recommended continued icing. -Injected 3cc 2:1 mixture 0.5 cc Marcaine :Kenolog 10mg /16ml at ankle joint right.    Return 2 weeks follow-up injection ankle right

## 2023-08-17 ENCOUNTER — Encounter: Payer: Self-pay | Admitting: Podiatry

## 2023-08-17 ENCOUNTER — Ambulatory Visit (INDEPENDENT_AMBULATORY_CARE_PROVIDER_SITE_OTHER): Admitting: Podiatry

## 2023-08-17 DIAGNOSIS — M25572 Pain in left ankle and joints of left foot: Secondary | ICD-10-CM | POA: Diagnosis not present

## 2023-08-17 MED ORDER — TRIAMCINOLONE ACETONIDE 10 MG/ML IJ SUSP
10.0000 mg | Freq: Once | INTRAMUSCULAR | Status: AC
  Administered 2023-08-17: 10 mg

## 2023-08-17 NOTE — Progress Notes (Signed)
 Patient presents follow-up injection ankle joint medial left.  Doing about the same.  Indicates pain on little further distally from the ankle joint for the next point tenderness.  Has not noticed any redness   Physical exam:  General appearance: Pleasant, and in no acute distress. AOx3.  Vascular: Pedal pulses: DP 2/4 bilaterally, PT 2/4 bilaterally.  Moderate edema lower legs ankle left . Capillary fill time immediate.  Neurological: Light touch intact feet bilaterally.  Normal Achilles reflex bilaterally.    Dermatologic:   Skin normal temperature bilaterally.  Skin normal color, tone, and texture bilaterally.   Musculoskeletal: Tenderness to palpation at the talonavicular joint left.  Some tenderness at the anterior medial ankle joint.  No tenderness with range of motion of the ankle joint.    Diagnosis: 1.  Arthralgia talonavicular joint left.  Plan: -Discussed the patient with pain she has been having today the pain appears to be more at the talonavicular joint and the ankle.  Will try an injection at the TMT joint today and see how much improvement -injected 3cc 2:1 mixture 0.5 cc Marcaine :Kenolog 10mg /40ml at talonavicular joint left.    Return 2 weeks follow-up injection

## 2023-08-31 ENCOUNTER — Ambulatory Visit (INDEPENDENT_AMBULATORY_CARE_PROVIDER_SITE_OTHER)

## 2023-08-31 ENCOUNTER — Encounter: Payer: Self-pay | Admitting: Podiatry

## 2023-08-31 ENCOUNTER — Ambulatory Visit (INDEPENDENT_AMBULATORY_CARE_PROVIDER_SITE_OTHER): Admitting: Podiatry

## 2023-08-31 DIAGNOSIS — L97522 Non-pressure chronic ulcer of other part of left foot with fat layer exposed: Secondary | ICD-10-CM

## 2023-08-31 DIAGNOSIS — M25572 Pain in left ankle and joints of left foot: Secondary | ICD-10-CM

## 2023-08-31 DIAGNOSIS — S93402A Sprain of unspecified ligament of left ankle, initial encounter: Secondary | ICD-10-CM

## 2023-08-31 DIAGNOSIS — M65972 Unspecified synovitis and tenosynovitis, left ankle and foot: Secondary | ICD-10-CM

## 2023-08-31 MED ORDER — MUPIROCIN 2 % EX OINT
1.0000 | TOPICAL_OINTMENT | Freq: Two times a day (BID) | CUTANEOUS | 0 refills | Status: AC
Start: 2023-08-31 — End: ?

## 2023-08-31 MED ORDER — TRIAMCINOLONE ACETONIDE 10 MG/ML IJ SUSP
10.0000 mg | Freq: Once | INTRAMUSCULAR | Status: AC
  Administered 2023-08-31: 10 mg

## 2023-08-31 NOTE — Addendum Note (Signed)
 Addended by: GIB ANTES R on: 08/31/2023 12:07 PM   Modules accepted: Orders

## 2023-08-31 NOTE — Progress Notes (Signed)
 Patient presents with multiple complaints.  Was having pain still along the medial foot and also started getting pain along the posterior tibial tendon along the medial foot and ankle.  She then twisted the left ankle and had pain in the lateral aspect of the foot and ankle.  She is also noticed an ulcer developing on the fifth toe on the left.  She has noticed no signs of infection on the fifth toe.   Physical exam:  General appearance: Pleasant, and in no acute distress. AOx3.  Vascular: Pedal pulses: DP 2/4 bilaterally, PT 2/4 bilaterally.  Edema fifth toe left and lateral ankle and foot left.  Neurological: Light touch intact feet bilaterally.  Normal Achilles reflex bilaterally.  No clonus or spasticity noted.   Dermatologic:   Full-thickness ulcer penetrating subcutaneous tissue fifth toe dorsal PIPJ left foot.  5 mm in diameter and 3 mm in depth clear drainage.  No signs of infection.  Moderate exudate.  Some ecchymosis on the lateral foot and ankle   Musculoskeletal: Tenderness at the ATFL and along the lateral ankle left.  Tenderness in the sinus tarsi with palpation and range of motion subtalar joint left tenderness at the talonavicular joint left.  Tenderness on posterior tibial tendon from malleolus to navicular tuberosity left.  Tenderness along the anterior ankle and tenderness with range of motion ankle left  Radiographs: 3 views foot left: No evidence any fractures in the midfoot.  Osteophytic changes plantar posterior aspect calcaneus.  Concerning dislocations.  Hammertoe fifth toe left.  No evidence of osteomyelitis in the fifth toe.  2 views ankle left: Normal ankle joint space.  No evidence of any fractures or dislocations.  No diastases injury.  Diagnosis: Lateral ankle sprain initial with fraying of the ATFL left Arthralgia ankle  joint left Arthralgia talonavicular joint left Full-thickness ulceration penetrating subcutaneous tissue fifth toe left with no signs of  infection Posterior tibial tenosynovitis left  Plan:  -Established office visit for evaluation and management level 3.  Modifier 25.  Discussed lateral ankle sprain and treatment.  Recommend RICE .discussed posterior tibial tendon tenosynovitis left and treatment.  Also discussed the arthralgia at the talonavicular joint.  This may be secondary to the arthralgia at the that she has been having at the midtarsal joints on the left foot  -Discussed arthralgia and ankle joint the talonavicular joint left.  Discussed ankle sprain.  Recommended RICE.  Discussed with the ulcer on things look out for as far as infection or worsening.  -Sharp debridement of full-thickness ulceration fifth toe left sharply debrided any devitalized tissue down to a good bleeding base.  Applied triple antibiotic and a light dressing.  - Wound care: Soak left foot twice daily warm salt water for 15 minutes.  Apply Bactroban  ointment and light dressing.  - Rx Bactroban  ointment applied twice daily to wound foot, refill x1  -Dispensed pneumatic cast left for ankle sprain  -Injected 3cc 2:1 mixture 0.5 cc Marcaine :Kenolog 10mg /56ml at ankle joint left.   Return 1 week for follow-up injection and ankle sprain and ulcer left

## 2023-09-08 ENCOUNTER — Ambulatory Visit (INDEPENDENT_AMBULATORY_CARE_PROVIDER_SITE_OTHER): Admitting: Podiatry

## 2023-09-08 DIAGNOSIS — M25572 Pain in left ankle and joints of left foot: Secondary | ICD-10-CM

## 2023-09-08 NOTE — Progress Notes (Signed)
 Presents follow-up for ankle sprain left, ulcer fifth toe left and pain in foot.  Doing much better.  She has been doing wound care on the ulcer on the fifth toe.  Still wearing the pneumatic cast although it feels much better than it was.  Still painful when she puts weight on it   Physical exam:  General appearance: Pleasant, and in no acute distress. AOx3.  Vascular: Pedal pulses: DP 2/4 bilaterally, PT 2/4 bilaterally.  Moderate  around ankle left. Capillary fill time immediate bilaterally.  Neurological: Grossly intact bilaterally  Dermatologic:   Ulcer fifth toe left healed with no sign skin normal temperature bilaterally.  Skin normal color, tone, and texture bilaterally.   Musculoskeletal: Tenderness sinus tarsi and with range of motion subtalar joint.  Some tenderness along the ATFL left.  No tenderness today with palpation along the anterior aspect of the ankle.  Some tenderness with range of motion of the ankle.    Diagnosis: 1.  Arthralgia subtalar joint left  Plan: -Ankle sprain seems to be healing well.  She still has some discomfort but not nearly as much as before.  Ulcer healed she can discontinue wound care on the fifth toe left -Continue pneumatic cast, continue icing Wente minutes an hour several times a Fitterer, continue elevation. -injected 3cc 2:1 mixture 0.5 cc Marcaine :Kenolog 10mg /40ml at subtalar joint and sinus tarsi left.   Return 2 weeks follow-up injection left

## 2023-09-22 ENCOUNTER — Ambulatory Visit (INDEPENDENT_AMBULATORY_CARE_PROVIDER_SITE_OTHER): Admitting: Podiatry

## 2023-09-22 DIAGNOSIS — M65972 Unspecified synovitis and tenosynovitis, left ankle and foot: Secondary | ICD-10-CM

## 2023-09-22 MED ORDER — TRIAMCINOLONE ACETONIDE 10 MG/ML IJ SUSP
10.0000 mg | Freq: Once | INTRAMUSCULAR | Status: AC
  Administered 2023-09-22: 10 mg

## 2023-09-22 NOTE — Progress Notes (Signed)
 Patient presents today follow-up ankle sprain and subtalar joint arthralgia.  Doing much better with these probably will 50% better at this point.  New complaint of pain today along the anterior foot and ankle indicates over the area of the anterior tibial tendon.  Does not recall any other injury to it recently.   Physical exam:  General appearance: Pleasant, and in no acute distress. AOx3.  Vascular: Pedal pulses: DP 2/4 bilaterally, PT 2/4 bilaterally.  Decreased edema foot and ankle left. Capillary fill time immediate left.  Neurological: Grossly intact  Dermatologic:   Skin normal temperature bilaterally.  Skin normal color, tone, and texture bilaterally.   Musculoskeletal: Tenderness to palpation along the anterior tibial tendon at the level of the ankle joint and talus.  No defects in Achilles tendon palpable.  No tenderness along the ankle joint proper.  Tenderness in the sinus tarsi with palpation and range of motion.  Some tenderness over the ATFL.  Normal muscle strength lower extremity left.    Diagnosis: 1.  Tenosynovitis anterior tibial tendon the left  Plan: -Continues to improve.  Not as much pain as before still wearing the pneumatic cast.  Much still has some pain around the ATFL and at the sinus tarsi but no tenderness along the ankle joint proper.  Discussed anterior tibial tenosynovitis left.  This may be as a result of her compensation engaged in utilization of muscles while wearing the pneumatic cast.  I told her to decrease her activity is much as possible. -injected 3cc 2:1 mixture 0.5 cc Marcaine :Kenolog 10mg /66ml at anterior tibial tendon sheath at the retinaculum of the left.    Return 2 weeks follow-up injection left and consider PT left

## 2023-10-06 ENCOUNTER — Telehealth: Payer: Self-pay | Admitting: Podiatry

## 2023-10-06 ENCOUNTER — Encounter: Payer: Self-pay | Admitting: Podiatry

## 2023-10-06 ENCOUNTER — Ambulatory Visit: Admitting: Podiatry

## 2023-10-06 DIAGNOSIS — M7662 Achilles tendinitis, left leg: Secondary | ICD-10-CM | POA: Diagnosis not present

## 2023-10-06 MED ORDER — PREDNISONE 5 MG PO TABS: ORAL_TABLET | ORAL | 1 refills | Status: AC

## 2023-10-06 NOTE — Addendum Note (Signed)
 Addended by: CHRISTINE NORLEEN LABOR on: 10/06/2023 02:12 PM   Modules accepted: Orders

## 2023-10-06 NOTE — Telephone Encounter (Signed)
 [facility] is requesting clarification regarding the prescription dosage: predniSONE  (DELTASONE ) 5 MG tablet   The current order is written for 10 tablets, but the instructions mention a 12-Odowd taper, which appears inconsistent.  Please verify the correct dosage and resend the updated prescription to [facility] at your earliest convenience, as the patient is currently at the pharmacy for pick-up.  Thank you!

## 2023-10-06 NOTE — Telephone Encounter (Signed)
 Thank you :)

## 2023-10-06 NOTE — Progress Notes (Signed)
 Presents today follow-up pain around the ankle left.  Says the pain on the sides of the ankle is doing much better has little bit of soreness indicating it along the Achilles tendon.  She still wearing the pneumatic cast.   Physical exam:  General appearance: Pleasant, and in no acute distress. AOx3.  Vascular: Pedal pulses: DP 2/4 bilaterally, PT 2/4 bilaterally.  Decreased edema lower legs bilaterally. Capillary fill time immediate bilaterally.  Neurological: Grossly intact bilaterally  Dermatologic:   Skin normal temperature bilaterally.  Skin normal color, tone, and texture bilaterally.   Musculoskeletal: Tenderness along the Achilles tendon about 7 cm from the insertion on the heel left.  there is a little bit of thickening of the tendon but no evidence of any tearing.  Normal muscle strength lower extremity bilaterally    Diagnosis: 1.  Achilles tendinitis left  Plan: -Established office visit for evaluation and management level 3. - Discontinue pneumatic cast. - Ice Achilles tendon 20 minutes an hour several times a [place]. - Rx prednisone  5 mg, 30 mg p.o. daily first [place]ann then decrease by 5 mg every other Roadcap for 12 days If not better after 4 weeks call for appointment.  Return as needed

## 2023-10-20 ENCOUNTER — Encounter: Payer: Self-pay | Admitting: Podiatry

## 2023-10-20 ENCOUNTER — Ambulatory Visit (INDEPENDENT_AMBULATORY_CARE_PROVIDER_SITE_OTHER): Admitting: Podiatry

## 2023-10-20 DIAGNOSIS — M65972 Unspecified synovitis and tenosynovitis, left ankle and foot: Secondary | ICD-10-CM

## 2023-10-20 MED ORDER — TRIAMCINOLONE ACETONIDE 10 MG/ML IJ SUSP
10.0000 mg | Freq: Once | INTRAMUSCULAR | Status: AC
Start: 2023-10-20 — End: 2023-10-20
  Administered 2023-10-20: 10 mg

## 2023-10-20 NOTE — Progress Notes (Signed)
 Patient presents today with complaint of pain along the medial aspect of the ankle.  For the Achilles pain and sinus tarsi pain is resolved.  Some tenderness along the lateral ankle.  Has not been using the pneumatic cast as instructed for the past several weeks.  Seems like it got worse not wearing the pneumatic cast.  Has not any noticed any redness or ecchymosis.  Physical exam:  General appearance: Pleasant, and in no acute distress. AOx3.  Vascular: Pedal pulses: DP 2/4 bilaterally, PT 2/4 bilaterally. Mild-moderate edema lower legs bilaterally. Capillary fill time immediate.  Neurological: Grossly intact bilaterally  Dermatologic:   Skin normal temperature bilaterally.  Skin normal color, tone, and texture bilaterally.   Musculoskeletal: Tenderness along posterior tibial tendon from the malleolus to its insertion at the navicular left.  +5 or +5 muscle strength in inversion.  Tenderness at PTT with inversion against resistance left minimal tenderness along the peroneal tendons and the sinus tarsi left.  No tenderness of Achilles tendon.  Some tenderness along the distal anterior muscles just proximal to the ankle   Diagnosis: 1.  Posterior tibial tenosynovitis of the left  Plan: -Discussed with her the posterior tibial tenosynovitis.  Sounds like the Achilles tendinitis and subtalar joint pain has mostly resolved.  Will try an injection along the PTT left today.  I suggested she wear the pneumatic cast for the next week just to help rest the left foot. -injected 3cc 2:1 mixture 0.5 cc Marcaine :Kenolog 10mg /20ml at posterior tibial tendon sheath left.    Return 2 weeks follow-up injection PTT left

## 2023-11-05 ENCOUNTER — Encounter: Payer: Self-pay | Admitting: Podiatry

## 2023-11-05 ENCOUNTER — Ambulatory Visit: Admitting: Podiatry

## 2023-11-05 DIAGNOSIS — M65972 Unspecified synovitis and tenosynovitis, left ankle and foot: Secondary | ICD-10-CM | POA: Diagnosis not present

## 2023-11-05 MED ORDER — TRIAMCINOLONE ACETONIDE 10 MG/ML IJ SUSP
10.0000 mg | Freq: Once | INTRAMUSCULAR | Status: AC
  Administered 2023-11-05: 10 mg

## 2023-11-05 NOTE — Progress Notes (Signed)
 Patient presents for follow-up injection on posterior tibial tendon sheath.  It was doing better significantly better for the first week but over the past few days pain has started to return again.  Also complains of her great toe with a blister and itching.  Has noticed redness and peeling skin around the hallux and a blister in the interdigital space right.  No fever chills nausea or vomiting.   Physical exam:  General appearance: Pleasant, and in no acute distress. AOx3.  Vascular: Pedal pulses: DP 2/4 bilaterally, PT 2 to/4 bilaterally.  Mild edema lower legs bilaterally. Capillary fill time immediate bilaterally.  Neurological: Grossly intact bilaterally  Dermatologic:   Redness and erythematous rash around the hallux and distal hallux right.  Blister on the lateral aspect of the hallux in the interspace.  Says she did bump the toe.  No tenderness to palpation of the IPJ or the hallux bones..  Skin normal temperature bilaterally.  Skin normal color, tone, and texture bilaterally.   Musculoskeletal: Tenderness along posterior tibial tendon on the medial ankle at the malleolus left.  +5 or +5 muscle strength in inversion.  No tenderness Achilles tendon or plantar fascia today.    Diagnosis: 1.  Posterior tibial tenosynovitis left  Plan: -Discussed the hallux on the right.  Difficult to say if this is more of an ingrown nail or dermatitis.  Given the blister and skin changes I am thinking it is more of a dermatitis.  Recommend she soak the foot for the next 2 weeks warm salt water twice a [place]ell applying antibiotic ointment and a light dressing.  If is not improved in 2 weeks may consider a steroid or if it looks more like an ingrown nail do an avulsion.  -injected 3cc 2:1 mixture 0.5 cc Marcaine :Kenolog 10mg /57ml at posterior tibial tendon sheath left.   Return 2 weeks follow-up injection posterior tibial tendon left and dermatitis/ingrown nail right

## 2023-11-18 ENCOUNTER — Encounter: Payer: Self-pay | Admitting: Podiatry

## 2023-11-18 ENCOUNTER — Ambulatory Visit (INDEPENDENT_AMBULATORY_CARE_PROVIDER_SITE_OTHER): Admitting: Podiatry

## 2023-11-18 DIAGNOSIS — M25572 Pain in left ankle and joints of left foot: Secondary | ICD-10-CM

## 2023-11-18 DIAGNOSIS — L600 Ingrowing nail: Secondary | ICD-10-CM

## 2023-11-18 NOTE — Progress Notes (Signed)
 Patient complains of painful ingrown both border(s) hallux right..  Has been soaking it and putting antibiotic ointment on it.  It is about the same size a blister underneath the first on the hallux right.  Stabbing pain in the left foot although the pain shifted now more towards the lateral ankle rather than medial ankle.  Patient denies fevers, chills, nausea, vomiting.  Objective:  Vitals: Reviewed  General: Well developed, nourished, in no acute distress, alert and oriented x3   Vascular: DP pulse 2/4 bilateral. PT pulse 2 to/4 bilateral.  Mild edema bilaterally  Dermatology: Erythema, edema, incurvated nail border hallux right with clear drainage . Tenderness present with palpation. Normal skin tone and texture feet with normal hair growth.  Redness and erythema around the nail folds.  Blister that was on the plantar aspect of the hallux is opened and circular there is a slight superficial wound there.  Neurological: Grossly intact. Normal reflexes.   Musculoskeletal: Tenderness with palpation of the distal hallux right. No tenderness or painful ROM at IPJ.  Still tenderness along the anterior ankle left and along the peroneal tendons.  The posterior tibial tendon pain seems resolved left.  Diagnosis: 1.  Ingrown nail hallux right 2.  Arthralgia ankle joint left  Plan: -Established office visit for evaluation and management level 3.  Modifier 25. - Office visit today on the left foot separate from the ingrown nail on the right foot.  Discussed with the continued pain.  At this point would recommend she wear the pneumatic cast for all weightbearing consistently for the next 4 weeks as well as taking the prescription naproxen.  Hopefully this will get the inflammation down it seems like for the past 2 months the pains been shifting from 1 area to the next probably for comp compensations.  I think we just immobilize it the pain can resolve.  If she is not any better after 4 weeks we will  probably consider an MRI at that point. -Rx: Naproxen 500 mg, 1 p.o. twice daily, 1 refill   Procedure(s):  -discussed etiology and treatment of ingrown nails. Discussed surgical vs conservative treatment. -Consent signed for appropriate avulsion affected nail(s).   - Avulsion total nail hallux right: Toe anesthetized with 3cc 2:1 mixture 2% Lidocaine  with epinephrine : Sodium Bicarbonate. Surgical site prepped. Digital tourniquet applied.  Avulsion of nail plate. performed.  Tourniquet released with good vascularity noticed in digit.  Applied triple antibiotic to nailbed and applied gauze and Coban dressing. - Written and oral postoperative instructions given.  -Return for post-op 2 weeks.  JINNY Prentice Binder, DPM

## 2023-12-03 ENCOUNTER — Ambulatory Visit (INDEPENDENT_AMBULATORY_CARE_PROVIDER_SITE_OTHER): Admitting: Podiatry

## 2023-12-03 VITALS — Ht 69.0 in | Wt 210.0 lb

## 2023-12-03 DIAGNOSIS — L97511 Non-pressure chronic ulcer of other part of right foot limited to breakdown of skin: Secondary | ICD-10-CM

## 2023-12-03 NOTE — Progress Notes (Signed)
 Patient presents for follow-up nail avulsion hallux nail right.  She has been doing well.  Has noticed a little skin breakdown on the plantar aspect of the hallux with a small ulcer forming.  Just had clear drainage.  Has not noticed any signs of infection.  Says the top of the toe is doing fine.   Physical exam:  General appearance: Pleasant, and in no acute distress. AOx3.  Vascular: Pedal pulses: DP 2/4 bilaterally, PT 2/4 bilaterally.  Mild to moderate edema lower legs bilaterally. Capillary fill time immediate bilaterally.  Neurological: Sensation loss feet bilaterally  Dermatologic:   4 mm diameter superficial ulcer just penetrating of the dermis on the plantar aspect of the hallux right.  Granulation tissue present.  No signs of infection or surrounding erythema.  Nail surgery site where the nail was avulsed on the hallux right is healed well.  Musculoskeletal:    Diagnosis: 1.  Superficial Wagner grade 1 ulceration plantar aspect hallux left  Plan: -Established office visit for evaluation and management level 3. -Discussed the ulceration on the plantar aspect of the foot.  Lightly debrided ulcer of slight devitalized tissue.  She can clean the area of the ulcer on the hallux right with soapy water twice daily, apply Bactroban  ointment and a light dressing.  She can discontinue the wound care of the nailbed on the hallux right.   Return 2 weeks follow-up left foot

## 2023-12-21 ENCOUNTER — Encounter: Payer: Self-pay | Admitting: Podiatry

## 2023-12-21 ENCOUNTER — Ambulatory Visit (INDEPENDENT_AMBULATORY_CARE_PROVIDER_SITE_OTHER): Admitting: Podiatry

## 2023-12-21 DIAGNOSIS — M25572 Pain in left ankle and joints of left foot: Secondary | ICD-10-CM | POA: Diagnosis not present

## 2023-12-21 MED ORDER — TRIAMCINOLONE ACETONIDE 40 MG/ML IJ SUSP
40.0000 mg | Freq: Once | INTRAMUSCULAR | Status: AC
  Administered 2023-12-21: 40 mg

## 2023-12-21 NOTE — Progress Notes (Signed)
 Patient presents today with complaints of pain at around the ankle joint especially anteriorly on the left.  Seems to radiate proximally and distally.  Some pain over the first tarsometatarsal joint on the left.   Physical exam:  General appearance: Pleasant, and in no acute distress. AOx3.  Vascular: Pedal pulses: DP 2/4 bilaterally, PT 2/4 bilaterally.  Moderate edema lower legs bilaterally. Capillary fill time immediate bilaterally.  Neurological: Light touch intact feet bilaterally.  Normal Achilles reflex bilaterally.    Dermatologic:   Skin normal temperature bilaterally.  Skin normal color, tone, and texture bilaterally.   Musculoskeletal: Tenderness on the anterior ankle and with range of motion of the ankle joint to left.  Some tenderness to palpation over the first tarsometatarsal joint and some tenderness with range of motion left.    Diagnosis: 1.  Arthralgia ankle joint left  Plan: -Some pain at the ankle joint and also some at the first tarsometatarsal joint on the left think most of the pain is coming from the ankle joint.  Will try an injection today. -injected 3cc 2:1 mixture 0.5 cc Marcaine :Kenolog 40mg /42ml at ankle joint left.  '  Return 2 weeks follow-up injection ankle left

## 2024-01-05 ENCOUNTER — Ambulatory Visit: Admitting: Podiatry

## 2024-01-10 ENCOUNTER — Ambulatory Visit (INDEPENDENT_AMBULATORY_CARE_PROVIDER_SITE_OTHER): Admitting: Podiatry

## 2024-01-10 DIAGNOSIS — M25572 Pain in left ankle and joints of left foot: Secondary | ICD-10-CM | POA: Diagnosis not present

## 2024-01-10 MED ORDER — PREDNISONE 5 MG PO TABS: ORAL_TABLET | ORAL | 1 refills | Status: AC

## 2024-01-10 NOTE — Progress Notes (Signed)
 Doing much better still is still having some pain around the anterior ankle and medial foot.  Not nearly as painful as it was.  Injection helped a lot.   Physical exam:  General appearance: Pleasant, and in no acute distress. AOx3.  Vascular: Pedal pulses: DP 2/4 bilaterally, PT 2/4 bilaterally.  Mild immediate bilaterally edema lower legs bilaterally. Capillary fill time immediate bilaterally.  Neurological: Grossly intact bilaterally  Dermatologic:   Skin normal temperature bilaterally.  Skin normal color, tone, and texture bilaterally.   Musculoskeletal: Some tenderness anterior ankle and no tenderness with range of motion ankle joint left.  Tenderness over the first tarsometatarsal joint and some at the talonavicular joint left.   Diagnosis: 1.  Arthralgia ankle joint left and talonavicular joint left  Plan: -Established office visit for evaluation and management.  Level 3. -Will try a round of oral 12-Cragun taper Lamisil.  Stills responds with his overall doing much better.  Wear good supportive shoe continue icing as needed -Rx prednisone  5 mg, 30 mg p.o. daily first [place], then decrease by 5 mg every other [place] for 12 days  Return as needed
